# Patient Record
Sex: Male | Born: 1980 | Hispanic: No | Marital: Single
Health system: Southern US, Community
[De-identification: ages and names within clinical notes are randomized; demographics above are authoritative.]

## PROBLEM LIST (undated history)

## (undated) DIAGNOSIS — F1010 Alcohol abuse, uncomplicated: Secondary | ICD-10-CM

---

## 2019-11-28 ENCOUNTER — Emergency Department (HOSPITAL_COMMUNITY): Payer: Medicaid Other

## 2019-11-28 ENCOUNTER — Inpatient Hospital Stay (HOSPITAL_COMMUNITY)
Admission: EM | Admit: 2019-11-28 | Discharge: 2019-12-02 | DRG: 375 | Disposition: A | Discharge status: Hospice | Payer: Medicaid Other | Attending: Internal Medicine | Admitting: Internal Medicine

## 2019-11-28 ENCOUNTER — Other Ambulatory Visit: Payer: Self-pay

## 2019-11-28 ENCOUNTER — Encounter (HOSPITAL_COMMUNITY): Payer: Self-pay | Admitting: Emergency Medicine

## 2019-11-28 DIAGNOSIS — D62 Acute posthemorrhagic anemia: Secondary | ICD-10-CM | POA: Diagnosis present

## 2019-11-28 DIAGNOSIS — K56609 Unspecified intestinal obstruction, unspecified as to partial versus complete obstruction: Secondary | ICD-10-CM | POA: Diagnosis present

## 2019-11-28 DIAGNOSIS — K7460 Unspecified cirrhosis of liver: Secondary | ICD-10-CM | POA: Diagnosis present

## 2019-11-28 DIAGNOSIS — Z515 Encounter for palliative care: Secondary | ICD-10-CM | POA: Diagnosis not present

## 2019-11-28 DIAGNOSIS — Z20822 Contact with and (suspected) exposure to covid-19: Secondary | ICD-10-CM | POA: Diagnosis present

## 2019-11-28 DIAGNOSIS — C187 Malignant neoplasm of sigmoid colon: Secondary | ICD-10-CM | POA: Diagnosis present

## 2019-11-28 DIAGNOSIS — R634 Abnormal weight loss: Secondary | ICD-10-CM

## 2019-11-28 DIAGNOSIS — Z6828 Body mass index (BMI) 28.0-28.9, adult: Secondary | ICD-10-CM | POA: Diagnosis not present

## 2019-11-28 DIAGNOSIS — C189 Malignant neoplasm of colon, unspecified: Secondary | ICD-10-CM | POA: Diagnosis present

## 2019-11-28 DIAGNOSIS — K621 Rectal polyp: Secondary | ICD-10-CM | POA: Diagnosis present

## 2019-11-28 DIAGNOSIS — R748 Abnormal levels of other serum enzymes: Secondary | ICD-10-CM | POA: Diagnosis present

## 2019-11-28 DIAGNOSIS — D509 Iron deficiency anemia, unspecified: Secondary | ICD-10-CM | POA: Diagnosis present

## 2019-11-28 DIAGNOSIS — K769 Liver disease, unspecified: Secondary | ICD-10-CM | POA: Diagnosis present

## 2019-11-28 DIAGNOSIS — K633 Ulcer of intestine: Secondary | ICD-10-CM | POA: Diagnosis present

## 2019-11-28 DIAGNOSIS — R180 Malignant ascites: Secondary | ICD-10-CM | POA: Diagnosis present

## 2019-11-28 DIAGNOSIS — R188 Other ascites: Secondary | ICD-10-CM

## 2019-11-28 DIAGNOSIS — M6284 Sarcopenia: Secondary | ICD-10-CM | POA: Diagnosis present

## 2019-11-28 DIAGNOSIS — D649 Anemia, unspecified: Secondary | ICD-10-CM

## 2019-11-28 DIAGNOSIS — C787 Secondary malignant neoplasm of liver and intrahepatic bile duct: Secondary | ICD-10-CM | POA: Diagnosis present

## 2019-11-28 DIAGNOSIS — Z66 Do not resuscitate: Secondary | ICD-10-CM | POA: Diagnosis present

## 2019-11-28 DIAGNOSIS — K641 Second degree hemorrhoids: Secondary | ICD-10-CM | POA: Diagnosis present

## 2019-11-28 DIAGNOSIS — M7989 Other specified soft tissue disorders: Secondary | ICD-10-CM | POA: Insufficient documentation

## 2019-11-28 DIAGNOSIS — R630 Anorexia: Secondary | ICD-10-CM | POA: Diagnosis present

## 2019-11-28 DIAGNOSIS — Z531 Procedure and treatment not carried out because of patient's decision for reasons of belief and group pressure: Secondary | ICD-10-CM | POA: Diagnosis present

## 2019-11-28 DIAGNOSIS — Z7189 Other specified counseling: Secondary | ICD-10-CM

## 2019-11-28 HISTORY — DX: Alcohol abuse, uncomplicated: F10.10

## 2019-11-28 LAB — URINALYSIS, ROUTINE W REFLEX MICROSCOPIC
Bilirubin Urine: NEGATIVE
Glucose, UA: NEGATIVE mg/dL
Hgb urine dipstick: NEGATIVE
Ketones, ur: NEGATIVE mg/dL
Leukocytes,Ua: NEGATIVE
Nitrite: NEGATIVE
Protein, ur: NEGATIVE mg/dL
Specific Gravity, Urine: >1.046 — ABNORMAL HIGH (ref 1.005–1.030)
pH: 5 (ref 5.0–8.0)

## 2019-11-28 LAB — COMPREHENSIVE METABOLIC PANEL
ALT: 34 U/L (ref 0–44)
AST: 65 U/L — ABNORMAL HIGH (ref 15–41)
Albumin: 2.5 g/dL — ABNORMAL LOW (ref 3.5–5.0)
Alkaline Phosphatase: 400 U/L — ABNORMAL HIGH (ref 38–126)
Anion gap: 7 (ref 5–15)
BUN: 9 mg/dL (ref 6–20)
CO2: 27 mmol/L (ref 22–32)
Calcium: 8.1 mg/dL — ABNORMAL LOW (ref 8.9–10.3)
Chloride: 102 mmol/L (ref 98–111)
Creatinine, Ser: 0.59 mg/dL — ABNORMAL LOW (ref 0.61–1.24)
GFR calc Af Amer: >60 mL/min (ref >60)
GFR calc non Af Amer: >60 mL/min (ref >60)
Glucose, Bld: 93 mg/dL (ref 70–99)
Potassium: 3.8 mmol/L (ref 3.5–5.1)
Sodium: 136 mmol/L (ref 135–145)
Total Bilirubin: 0.8 mg/dL (ref 0.3–1.2)
Total Protein: 6.5 g/dL (ref 6.5–8.1)

## 2019-11-28 LAB — PROTIME-INR
INR: 1.1 (ref 0.8–1.2)
Prothrombin Time: 14.2 seconds (ref 11.4–15.2)

## 2019-11-28 LAB — CBC WITH DIFFERENTIAL/PLATELET
Abs Immature Granulocytes: 0.06 10*3/uL (ref 0.00–0.07)
Basophils Absolute: 0.1 10*3/uL (ref 0.0–0.1)
Basophils Relative: 1 %
Eosinophils Absolute: 0.2 10*3/uL (ref 0.0–0.5)
Eosinophils Relative: 3 %
HCT: 21.7 % — ABNORMAL LOW (ref 39.0–52.0)
Hemoglobin: 5.9 g/dL — CL (ref 13.0–17.0)
Immature Granulocytes: 1 %
Lymphocytes Relative: 21 %
Lymphs Abs: 1.7 10*3/uL (ref 0.7–4.0)
MCH: 22.2 pg — ABNORMAL LOW (ref 26.0–34.0)
MCHC: 27.2 g/dL — ABNORMAL LOW (ref 30.0–36.0)
MCV: 81.6 fL (ref 80.0–100.0)
Monocytes Absolute: 0.6 10*3/uL (ref 0.1–1.0)
Monocytes Relative: 7 %
Neutro Abs: 5.7 10*3/uL (ref 1.7–7.7)
Neutrophils Relative %: 67 %
Platelets: 323 10*3/uL (ref 150–400)
RBC: 2.66 MIL/uL — ABNORMAL LOW (ref 4.22–5.81)
RDW: 18 % — ABNORMAL HIGH (ref 11.5–15.5)
WBC: 8.4 10*3/uL (ref 4.0–10.5)
nRBC: 0 % (ref 0.0–0.2)

## 2019-11-28 LAB — TYPE AND SCREEN
ABO/RH(D): O POS
Antibody Screen: NEGATIVE

## 2019-11-28 LAB — POC OCCULT BLOOD, ED: Fecal Occult Bld: NEGATIVE

## 2019-11-28 LAB — HIV ANTIBODY (ROUTINE TESTING W REFLEX): HIV Screen 4th Generation wRfx: NONREACTIVE

## 2019-11-28 LAB — IRON AND TIBC
Iron: 33 ug/dL — ABNORMAL LOW (ref 45–182)
Saturation Ratios: 10 % — ABNORMAL LOW (ref 17.9–39.5)
TIBC: 344 ug/dL (ref 250–450)
UIBC: 311 ug/dL

## 2019-11-28 LAB — ABO/RH: ABO/RH(D): O POS

## 2019-11-28 LAB — FERRITIN: Ferritin: 22 ng/mL — ABNORMAL LOW (ref 24–336)

## 2019-11-28 MED ORDER — ONDANSETRON HCL 4 MG PO TABS: 4.0000 mg | ORAL_TABLET | Freq: Four times a day (QID) | ORAL | Status: DC | PRN

## 2019-11-28 MED ORDER — PEG-KCL-NACL-NASULF-NA ASC-C 100 G PO SOLR: 1.0000 | Freq: Once | ORAL | Status: DC

## 2019-11-28 MED ORDER — BISACODYL 5 MG PO TBEC
20.0000 mg | DELAYED_RELEASE_TABLET | Freq: Once | ORAL | Status: AC
  Administered 2019-11-28: 17:00:00 20 mg via ORAL
  Filled 2019-11-28: qty 4

## 2019-11-28 MED ORDER — ACETAMINOPHEN 325 MG PO TABS: 650.0000 mg | ORAL_TABLET | Freq: Four times a day (QID) | ORAL | Status: DC | PRN

## 2019-11-28 MED ORDER — SODIUM CHLORIDE 0.9 % IV SOLN
510.0000 mg | Freq: Once | INTRAVENOUS | Status: AC
  Administered 2019-11-28: 510 mg via INTRAVENOUS
  Filled 2019-11-28: qty 17

## 2019-11-28 MED ORDER — SODIUM CHLORIDE 0.9% FLUSH
3.0000 mL | Freq: Two times a day (BID) | INTRAVENOUS | Status: DC
  Administered 2019-11-28 – 2019-12-01 (×6): 3 mL via INTRAVENOUS

## 2019-11-28 MED ORDER — METOCLOPRAMIDE HCL 5 MG/ML IJ SOLN
10.0000 mg | Freq: Once | INTRAMUSCULAR | Status: AC
  Administered 2019-11-29: 10 mg via INTRAVENOUS
  Filled 2019-11-28: qty 2

## 2019-11-28 MED ORDER — ALBUTEROL SULFATE (2.5 MG/3ML) 0.083% IN NEBU: 2.5000 mg | INHALATION_SOLUTION | Freq: Four times a day (QID) | RESPIRATORY_TRACT | Status: DC | PRN

## 2019-11-28 MED ORDER — ONDANSETRON HCL 4 MG/2ML IJ SOLN: 4.0000 mg | Freq: Four times a day (QID) | INTRAMUSCULAR | Status: DC | PRN

## 2019-11-28 MED ORDER — ACETAMINOPHEN 650 MG RE SUPP: 650.0000 mg | Freq: Four times a day (QID) | RECTAL | Status: DC | PRN

## 2019-11-28 MED ORDER — PEG-KCL-NACL-NASULF-NA ASC-C 100 G PO SOLR
0.5000 | Freq: Once | ORAL | Status: AC
  Administered 2019-11-28: 100 g via ORAL
  Filled 2019-11-28: qty 1

## 2019-11-28 MED ORDER — METOCLOPRAMIDE HCL 5 MG/ML IJ SOLN
10.0000 mg | Freq: Once | INTRAMUSCULAR | Status: AC
  Administered 2019-11-28: 10 mg via INTRAVENOUS
  Filled 2019-11-28: qty 2

## 2019-11-28 MED ORDER — IOHEXOL 300 MG/ML  SOLN
100.0000 mL | Freq: Once | INTRAMUSCULAR | Status: AC | PRN
  Administered 2019-11-28: 100 mL via INTRAVENOUS

## 2019-11-28 MED ORDER — PEG-KCL-NACL-NASULF-NA ASC-C 100 G PO SOLR
0.5000 | Freq: Once | ORAL | Status: AC
  Administered 2019-11-28: 100 g via ORAL

## 2019-11-28 NOTE — ED Notes (Signed)
Pt transported to CT ?

## 2019-11-28 NOTE — H&P (View-Only) (Signed)
[facility] Consult: 2:13 PM 11/28/2019  LOS: 0 days    Referring Provider: Dr Tyrone Nine in ED  Primary Care Physician:  Patient, No Pcp Per Primary Gastroenterologist:  Eddie Zhang [phone]    Reason for Consultation:  Anemia.  Hgb 5.9   HPI: Eddie Zhang is a 39 y.o. male.  No significant medical or surgical history but does not go to the doctor.  Previously heavy alcohol use of about 8 beers daily but quit altogether May 2020.  Pt is Jehovah's Witness and refuses blood transfusion.  His first language is Spanish and his English is very limited but his cousin was able to interpret for this encounter  After stopping alcohol last year he rapidly lost about 45 pounds.  Previously weighed 225 #and currently about 184 #.   Presented to ED today for evaluation.  The bulk of his current symptoms/complaints have been present for at least 2 months.   - Constipation where he feels like he needs to have a bowel movement but only produces small amounts of solid brown stool.  Sometimes sees blood in his stool but not excessive amounts.   - Progressive swelling in his legs including thighs and lower legs.  Swelling in his abdomen which is uncomfortable as it feels tight but not painful.   - Anorexia but no nausea, vomiting.    No unusual bleeding or bruising.  Has itching on his feet, sometimes on his belly.  Fatigue, dyspnea and shortness of breath on exertion, weakness.  Urine is dark. Hgb 5.9.  MCV 81.  Platelets 323.  WBCs 8.4.  INR 1.1 T bili 0.8, alkaline phosphatase 400.  AST/ALT 65/34.  Albumin 2.5. FOBT negative. CTAP w contrast: Apple core appearance in thickened sigmoid colon suspicious for colon cancer.  Marked hepatomegaly and infiltrating masses in the liver, clearly metastatic.   Widespread lymphadenopathy.  Moderate ascites.  Diffuse anasarca.  Social history.   Alcohol consumption and sobriety as above.  Remote snorting of cocaine.  Previously lived alone but about 2 weeks ago his cousin, sees our [place] convinced Mr. Derring to move in with him.  Previously worked in [employer] but has not been employed for many months.  Family history No awareness on the part of the patient or his cousin of family history of colorectal cancer, liver disease.      Past Medical History:  Diagnosis Date  . ETOH abuse    sober since may 2020.      History reviewed. No pertinent surgical history.  Prior to Admission medications   Not on File  None  Scheduled Meds:  Infusions:  PRN Meds:    Allergies as of 11/28/2019  . (No Known Allergies)    No family history on file.  Social History   Socioeconomic History  . Marital status: Single    Spouse name: Not on file  . Number of children: Not on file  . Years of education: Not on file  . Highest education level: Not on file  Occupational History  .  Not on file  Tobacco Use  . Smoking status: Not on file  Substance and Sexual Activity  . Alcohol use: Not Currently    Comment: Quit consuming alcohol in 11/2018.  Was drinking 812 ounce cans of beer daily.  . Drug use: Not Currently    Types: Cocaine    Comment: Years ago used to snort cocaine, never injected.  Marland Kitchen Sexual activity: Not on file  Other Topics Concern  . Not on file  Social History Narrative  . Not on file   Social Determinants of Health   Financial Resource Strain:   . Difficulty of Paying Living Expenses:   Food Insecurity:   . Worried About Charity fundraiser in the Last Year:   . Arboriculturist in the Last Year:   Transportation Needs:   . Film/video editor (Medical):   Marland Kitchen Lack of Transportation (Non-Medical):   Physical Activity:   . Days of Exercise per Week:   . Minutes of Exercise per Session:   Stress:   .  Feeling of Stress :   Social Connections:   . Frequency of Communication with Friends and Family:   . Frequency of Social Gatherings with Friends and Family:   . Attends Religious Services:   . Active Member of Clubs or Organizations:   . Attends Archivist Meetings:   Marland Kitchen Marital Status:   Intimate Partner Violence:   . Fear of Current or Ex-Partner:   . Emotionally Abused:   Marland Kitchen Physically Abused:   . Sexually Abused:     REVIEW OF SYSTEMS: Constitutional: Per HPI ENT:  No nose bleeds Pulm: Per HPI. CV:  No palpitations, no LE edema.  No angina GU:  No hematuria, no frequency GI: Per HPI. Heme: Per HPI. Transfusions: None and refuses blood product transfusions for religious reasons. Neuro:  No headaches, no peripheral tingling or numbness.  No syncope, no seizures. Derm:  No rash or sores.  Itching on his feet and abdomen. Endocrine:  No sweats or chills.  No polyuria or dysuria Immunization: Has not been vaccinated for Covid and I did not ask him about other immunizations. Travel:  None beyond local counties in last few months.    PHYSICAL EXAM: Vital signs in last 24 hours: Vitals:   11/28/19 1230 11/28/19 1400  BP: (!) 113/59 113/65  Pulse: 81 87  Resp: 12 13  Temp:    SpO2: 100% 100%   Wt Readings from Last 3 Encounters:  11/28/19 83.5 kg    General: Gaunt, cachectic, slightly icteric Head: Facies symmetric.  Hollowing of facial musculature. Eyes: Slightly icteric sclera.  Pale conjunctiva. Ears: Not hard of hearing Nose: No congestion or discharge Mouth: Fair dentition.  Tongue midline.  Mucosa pink, moist, clear. Neck: No JVD, no masses. Lungs: Clear bilaterally, good breath sounds. Heart: RRR.  No MRG.  S1, S2 present Abdomen: Protuberant, distended.  Liver palpable beneath surface of skin and very bumpy.  Liver markedly enlarged to the level of the umbilicus.  Umbilical hernia.  No tenderness..   Rectal: Deferred.  Stool submitted to lab for  testing was FOBT negative earlier today. Musc/Skeltl: His arms and musculature on his face and torso are diminished with sarcopenia.  Legs are swollen with anasarca. Extremities: Anasarca in the thighs and lower legs, feet. Neurologic: No asterixis or tremor.  Moves all 4 limbs without issue, strength not tested.  Oriented x3.  Good historian with the translating assistance of his cousin. Skin:  Sallow, jaundiced.  Some scabbed over small sores on the abdomen.  No telangiectasia. Tattoos: None observed. Nodes: No cervical adenopathy Psych:  Pleasant, calm, cooperative.  Fluid speech.    Intake/Output from previous day: No intake/output data recorded. Intake/Output this shift: No intake/output data recorded.  LAB RESULTS: Recent Labs    11/28/19 1001  WBC 8.4  HGB 5.9*  HCT 21.7*  PLT 323   BMET Lab Results  Component Value Date   NA 136 11/28/2019   K 3.8 11/28/2019   CL 102 11/28/2019   CO2 27 11/28/2019   GLUCOSE 93 11/28/2019   BUN 9 11/28/2019   CREATININE 0.59 (L) 11/28/2019   CALCIUM 8.1 (L) 11/28/2019   LFT Recent Labs    11/28/19 1001  PROT 6.5  ALBUMIN 2.5*  AST 65*  ALT 34  ALKPHOS 400*  BILITOT 0.8   PT/INR Lab Results  Component Value Date   INR 1.1 11/28/2019   Hepatitis Panel No results for input(s): HEPBSAG, HCVAB, HEPAIGM, HEPBIGM in the last 72 hours. C-Diff No components found for: CDIFF Lipase  No results found for: LIPASE  Drugs of Abuse  No results found for: LABOPIA, COCAINSCRNUR, LABBENZ, AMPHETMU, THCU, LABBARB   RADIOLOGY STUDIES: CT ABDOMEN PELVIS W CONTRAST  Result Date: 11/28/2019 CLINICAL DATA:  Abdominal distension. History of cirrhosis. EXAM: CT ABDOMEN AND PELVIS WITH CONTRAST TECHNIQUE: Multidetector CT imaging of the abdomen and pelvis was performed using the standard protocol following bolus administration of intravenous contrast. CONTRAST:  129mL OMNIPAQUE IOHEXOL 300 MG/ML  SOLN COMPARISON:  None. FINDINGS: Lower  chest: No acute abnormality. Hepatobiliary: Liver is markedly enlarged. Infiltrative masses are seen throughout the liver, majority are difficult to measure due to poorly defined margins, largest measurable lesion within the inferior aspects of the LEFT liver lobe (segment 4B), measuring approximately 6 cm greatest dimension. Gallbladder is decompressed. No bile duct dilatation seen. Pancreas: Grossly normal. Spleen: Unremarkable. Adrenals/Urinary Tract: RIGHT kidney is compressed/displaced by the enlarged RIGHT hepatic lobe, but appears otherwise unremarkable without suspicious mass, stone or hydronephrosis. LEFT kidney is unremarkable without mass, stone or hydronephrosis. Bladder is decompressed. Stomach/Bowel: Irregular thickening of the walls of the sigmoid colon, highly suspicious for annular constricting apple-core neoplastic mass. Fairly large amount of stool throughout the more proximal portion of the colon which is nondistended. No dilated small bowel loops. Vascular/Lymphatic: No acute appearing vascular abnormality. Conglomerate lymphadenopathy within the upper periaortic retroperitoneum,, measuring approximately 2 cm short axis dimension (series 3, image 44). Suspect additional retroperitoneal and mesenteric lymphadenopathy, obscured by adjacent structures. Additional mildly prominent lymph nodes within the bilateral inguinal regions. Reproductive: Prostate is unremarkable. Other: Moderate amount of ascites within the abdomen and pelvis. Musculoskeletal: No acute or suspicious osseous finding. Diffuse anasarca. IMPRESSION: 1. Irregular thickening of the walls of the sigmoid colon (series 3, image 84), with an annular constricting "apple-core" appearance, highly suspicious for colon cancer. 2. Infiltrative masses throughout the liver, with associated marked hepatomegaly, majority of the liver lesions being difficult to measure due to poorly defined margins, largest measurable lesion within the inferior  aspects of the LEFT liver lobe (segment 4B), almost certainly metastatic in etiology. 3. Conglomerate lymphadenopathy within the upper periaortic retroperitoneum, measuring approximately 2 cm short axis dimension, highly suspicious for additional metastatic disease. Additional mildly prominent lymph nodes within the bilateral inguinal regions. Suspect additional retroperitoneal and mesenteric lymphadenopathy obscured by adjacent structures. 4. Moderate amount of ascites within the abdomen and pelvis. 5. Diffuse anasarca. Electronically Signed   By: Cherlynn Kaiser  Enriqueta Shutter M.D.   On: 11/28/2019 13:35      IMPRESSION:   *   Metastatic sigmoid cancer.  *    Anemia, Hb 5.9, borderline low normal MCV.  Probably iron deficient. He is a Restaurant manager, fast food and as of my conversation with pt and his cousin, patient does not want blood products.  *   Ascites.  ? Malignant?    PLAN:     *   Lack of acceptance for blood transfusion complicates work-up.  His anemia confers high risk for respiratory/cardiac complications especially if we were to perform colonoscopy which requires sedation. The other approach to a diagnosis is liver biopsy but this confers even higher risk in terms of potential bleeding complications. I encouraged the patient and his cousin to reconsider excepting transfusion.  *   Pursue CT chest for further cancer staging?   *    Allow clear liquids.   Azucena Freed  11/28/2019, 2:13 PM Phone [phone]

## 2019-11-28 NOTE — ED Provider Notes (Addendum)
[facility] EMERGENCY DEPARTMENT Provider Note   CSN: [id number] Arrival date & time: 11/28/19  0944     History Chief Complaint  Patient presents with  . Leg Swelling  . Ascites    Eddie Zhang is a 39 y.o. male.  39 y.o male with no PMH presents to the ED with a chief complaint of abdominal distention x months. According, to patient he had stopped drinking alcohol about a year ago, reports he had weight loss, this caused his abdomen to begin swelling.  He reports the swelling has been ongoing since last year, worsen these past couple months.  He also endorses several episodes of bloody diarrhea which contains blood, this has been ongoing for the past month.  According to patient he was in our diagnosed with cirrhosis.  He does report taking "an antibiotic ", such as penicillin to help with the swelling.  He denies any fever, nausea, vomiting, shortness of breath, chest pain.  The history is provided by the patient and medical records.       Past Medical History:  Diagnosis Date  . ETOH abuse    sober since may 2020.      There are no problems to display for this patient.   History reviewed. No pertinent surgical history.     No family history on file.  Social History   Tobacco Use  . Smoking status: Not on file  Substance Use Topics  . Alcohol use: Not Currently    Comment: Quit consuming alcohol in 11/2018.  Was drinking 812 ounce cans of beer daily.  . Drug use: Not Currently    Types: Cocaine    Comment: Years ago used to snort cocaine, never injected.    Home Medications Prior to Admission medications   Not on File    Allergies    Patient has no known allergies.  Review of Systems   Review of Systems  Constitutional: Negative for fever.  HENT: Negative for sore throat.   Respiratory: Negative for shortness of breath.   Cardiovascular: Negative for chest pain.  Gastrointestinal: Positive for abdominal pain and blood in stool.  Negative for nausea and vomiting.  Genitourinary: Negative for flank pain.  Neurological: Negative for light-headedness and headaches.  All other systems reviewed and are negative.   Physical Exam Updated Vital Signs BP 113/66   Pulse 88   Temp 99.4 F (37.4 C) (Oral)   Resp 16   Wt 83.5 kg   SpO2 100%   Physical Exam Vitals and nursing note reviewed.  Constitutional:      Appearance: Normal appearance. He is ill-appearing. He is not toxic-appearing.  HENT:     Head: Normocephalic and atraumatic.     Nose: Nose normal.     Mouth/Throat:     Mouth: Mucous membranes are moist.  Eyes:     General: No scleral icterus.    Pupils: Pupils are equal, round, and reactive to light.  Cardiovascular:     Rate and Rhythm: Normal rate.     Comments: Bilateral pitting edema, swelling is greater on left of the right. Pulmonary:     Effort: Pulmonary effort is normal.  Abdominal:     General: Abdomen is protuberant. There is distension.     Palpations: There is fluid wave.     Tenderness: There is generalized abdominal tenderness and tenderness in the epigastric area and left upper quadrant. There is no right CVA tenderness, left CVA tenderness, guarding or rebound.  Hernia: A hernia is present. Hernia is present in the umbilical area.  Musculoskeletal:     Cervical back: Normal range of motion and neck supple.     Right lower leg: 2+ Edema present.     Left lower leg: Tenderness present. 2+ Edema present.  Skin:    General: Skin is warm and dry.  Neurological:     Mental Status: He is alert and oriented to person, place, and time.     ED Results / Procedures / Treatments   Labs (all labs ordered are listed, but only abnormal results are displayed) Labs Reviewed  COMPREHENSIVE METABOLIC PANEL - Abnormal; Notable for the following components:      Result Value   Creatinine, Ser 0.59 (*)    Calcium 8.1 (*)    Albumin 2.5 (*)    AST 65 (*)    Alkaline Phosphatase 400 (*)     All other components within normal limits  CBC WITH DIFFERENTIAL/PLATELET - Abnormal; Notable for the following components:   RBC 2.66 (*)    Hemoglobin 5.9 (*)    HCT 21.7 (*)    MCH 22.2 (*)    MCHC 27.2 (*)    RDW 18.0 (*)    All other components within normal limits  PROTIME-INR  URINALYSIS, ROUTINE W REFLEX MICROSCOPIC  POC OCCULT BLOOD, ED    EKG None  Radiology CT ABDOMEN PELVIS W CONTRAST  Result Date: 11/28/2019 CLINICAL DATA:  Abdominal distension. History of cirrhosis. EXAM: CT ABDOMEN AND PELVIS WITH CONTRAST TECHNIQUE: Multidetector CT imaging of the abdomen and pelvis was performed using the standard protocol following bolus administration of intravenous contrast. CONTRAST:  154mL OMNIPAQUE IOHEXOL 300 MG/ML  SOLN COMPARISON:  None. FINDINGS: Lower chest: No acute abnormality. Hepatobiliary: Liver is markedly enlarged. Infiltrative masses are seen throughout the liver, majority are difficult to measure due to poorly defined margins, largest measurable lesion within the inferior aspects of the LEFT liver lobe (segment 4B), measuring approximately 6 cm greatest dimension. Gallbladder is decompressed. No bile duct dilatation seen. Pancreas: Grossly normal. Spleen: Unremarkable. Adrenals/Urinary Tract: RIGHT kidney is compressed/displaced by the enlarged RIGHT hepatic lobe, but appears otherwise unremarkable without suspicious mass, stone or hydronephrosis. LEFT kidney is unremarkable without mass, stone or hydronephrosis. Bladder is decompressed. Stomach/Bowel: Irregular thickening of the walls of the sigmoid colon, highly suspicious for annular constricting apple-core neoplastic mass. Fairly large amount of stool throughout the more proximal portion of the colon which is nondistended. No dilated small bowel loops. Vascular/Lymphatic: No acute appearing vascular abnormality. Conglomerate lymphadenopathy within the upper periaortic retroperitoneum,, measuring approximately 2 cm short  axis dimension (series 3, image 44). Suspect additional retroperitoneal and mesenteric lymphadenopathy, obscured by adjacent structures. Additional mildly prominent lymph nodes within the bilateral inguinal regions. Reproductive: Prostate is unremarkable. Other: Moderate amount of ascites within the abdomen and pelvis. Musculoskeletal: No acute or suspicious osseous finding. Diffuse anasarca. IMPRESSION: 1. Irregular thickening of the walls of the sigmoid colon (series 3, image 84), with an annular constricting "apple-core" appearance, highly suspicious for colon cancer. 2. Infiltrative masses throughout the liver, with associated marked hepatomegaly, majority of the liver lesions being difficult to measure due to poorly defined margins, largest measurable lesion within the inferior aspects of the LEFT liver lobe (segment 4B), almost certainly metastatic in etiology. 3. Conglomerate lymphadenopathy within the upper periaortic retroperitoneum, measuring approximately 2 cm short axis dimension, highly suspicious for additional metastatic disease. Additional mildly prominent lymph nodes within the bilateral inguinal regions. Suspect additional retroperitoneal  and mesenteric lymphadenopathy obscured by adjacent structures. 4. Moderate amount of ascites within the abdomen and pelvis. 5. Diffuse anasarca. Electronically Signed   By: Franki Cabot M.D.   On: 11/28/2019 13:35    Procedures .Critical Care Performed by: Janeece Fitting, PA-C Authorized by: Janeece Fitting, PA-C   Critical care provider statement:    Critical care time (minutes):  45   Critical care start time:  11/28/2019 12:00 PM   Critical care end time:  11/21/2019 12:45 PM   Critical care time was exclusive of:  Separately billable procedures and treating other patients   Critical care was necessary to treat or prevent imminent or life-threatening deterioration of the following conditions:  Circulatory failure   Critical care was time spent  personally by me on the following activities:  Blood draw for specimens, development of treatment plan with patient or surrogate, discussions with consultants, evaluation of patient's response to treatment, examination of patient, obtaining history from patient or surrogate, ordering and performing treatments and interventions, ordering and review of laboratory studies, ordering and review of radiographic studies, pulse oximetry, re-evaluation of patient's condition and review of old charts   (including critical care time)  Medications Ordered in ED Medications  iohexol (OMNIPAQUE) 300 MG/ML solution 100 mL (100 mLs Intravenous Contrast Given 11/28/19 1312)    ED Course  I have reviewed the triage vital signs and the nursing notes.  Pertinent labs & imaging results that were available during my care of the patient were reviewed by me and considered in my medical decision making (see chart for details).    MDM Rules/Calculators/A&P   Patient with no pertinent past medical history presents to the ED with complaints of swelling to the abdomen along with swelling to bilateral lower extremities which has been ongoing for the past year.  Patient reports these symptoms likely worsened in the past month, has noted this only has gotten out of control in the past 2 days.  He does not have any prior history of psoriasis, does report his belly was swollen "after him losing a bunch of weight a year ago and discontinuing alcohol use ".  Patient has been sober for over a year.  He does report taking penicillin to help with the swelling which did not improve his symptoms.  No prior history of heart failure, does endorse bloody stools, states that most of his stool has been consistent with diarrhea and blood on the toilet this has been ongoing for several weeks to months.  During my evaluation patient is ill-appearing, nontoxic, vitals are within normal limits. Lungs are clear to auscultation.  Abdomen appears  significantly distended and indurated, hepatomegaly noted on exam, fluid wave appreciated on exam.  BL 2+ pitting edema, left calf tenderness > right calf. He reports attempted to take "penicillin "for his symptoms without improvement.  Interpretation of his labs were remarkable for a CBC with a hemoglobin of 5.9, he does endorse having multiple episodes of bloody diarrhea for several months, states he notes the toilet is covered in blood after his bowel movements.  White blood cell count is within normal limits.  PT and INR are unremarkable.  CMP without any electrolyte derangement, creatinine level is decreased.  LFTs are just slightly elevated on his AST.  Phos elevated at 400.  11:52 AM Rectal exam performed by me, no gross blood on my exam.   11:48 AM Consult placed to GI, for guidance on imaging to further evaluate patients condition.  12:14 PM Spoke  to Judson Roch PA and discussed case with her, Hemoccult has returned back as negative, we discussed likely chance of malignancy as his LFTs are within normal limits.  We will proceed with CT abdomen and pelvis to further evaluate his condition.  Due to patient's symptomatic anemia will need to hospitalist service.  Of note, patient is Raymond Gurney witness and has refused blood at this time x 2.   CT Abdomen and pelvis:  1. Irregular thickening of the walls of the sigmoid colon (series 3,  image 84), with an annular constricting "apple-core" appearance,  highly suspicious for colon cancer.  2. Infiltrative masses throughout the liver, with associated marked  hepatomegaly, majority of the liver lesions being difficult to  measure due to poorly defined margins, largest measurable lesion  within the inferior aspects of the LEFT liver lobe (segment 4B),  almost certainly metastatic in etiology.  3. Conglomerate lymphadenopathy within the upper periaortic  retroperitoneum, measuring approximately 2 cm short axis dimension,  highly suspicious for  additional metastatic disease. Additional  mildly prominent lymph nodes within the bilateral inguinal regions.  Suspect additional retroperitoneal and mesenteric lymphadenopathy  obscured by adjacent structures.  4. Moderate amount of ascites within the abdomen and pelvis.  5. Diffuse anasarca.      These results were discussed with patient along with family member at the bedside.  A call was placed for hospitalist in order to obtain further admission for management of patient's condition.   2:56 PM Spoke to hospitalist Dr. Tamala Julian who will admit patient for further management.   Portions of this note were generated with Lobbyist. Dictation errors may occur despite best attempts at proofreading.  Final Clinical Impression(s) / ED Diagnoses Final diagnoses:  Leg swelling  Other ascites  Anemia, unspecified type    Rx / DC Orders ED Discharge Orders    None       Janeece Fitting, PA-C 11/28/19 [place], Nychelle Cassata, PA-C 11/28/19 [place], DO 11/28/19 1513

## 2019-11-28 NOTE — H&P (Addendum)
History and Physical    Eddie Zhang [id number] DOB: 03-Aug-1980 DOA: 11/28/2019  Referring MD/NP/PA: Gust Brooms, PA-C PCP: Patient, No Pcp Per  Patient coming from: Home   Chief Complaint: Abdominal swelling  I have personally briefly reviewed patient's old medical records in [place]   HPI: Eddie Zhang is a 39 y.o. male with medical history significant of Jehovah's witness and alcohol abuse.  He presents with complaints of significantly worsening abdominal swelling for the last 2 and half months.  History is obtained with assistance of his cousin who is present at bedside.  When he quit drinking in May 2020 patient reported weighing around 225 pounds, but since that time had loss a significant amount of weight and currently reported being down to around 184 pounds.  Over the last couple months he has intermittently had blood in his stools.  Notes associated symptoms of abdominal tightness, leg swelling, poor appetite, generalized malaise, and shortness of breath.  Denies any known family history of liver disease or colon cancer.   ED Course: Patient was noted to have relatively stable vital signs.  Labs significant for hemoglobin 5.9, alkaline phosphatase 400, AST 65, ALT 34, and total bilirubin 0.8.  CT scan of the abdomen and pelvis significant for sigmoid colon thickening with apple core sign concerning for malignancy with widespread lymphadenopathy, liver masses, and ascites.  Gastroenterology had been formally consulted.  TRH called to admit.  Review of Systems  Constitutional: Positive for malaise/fatigue.  HENT: Negative for ear discharge and nosebleeds.   Eyes: Negative for photophobia and pain.  Respiratory: Positive for shortness of breath. Negative for cough.   Cardiovascular: Positive for leg swelling. Negative for chest pain.  Gastrointestinal: Positive for constipation. Negative for diarrhea, nausea and vomiting.       Abdominal distention  Genitourinary:  Negative for dysuria and hematuria.  Musculoskeletal: Negative for falls.  Neurological: Negative for loss of consciousness.  Psychiatric/Behavioral: Negative for memory loss and substance abuse.    Past Medical History:  Diagnosis Date  . ETOH abuse    sober since may 2020.      History reviewed. No pertinent surgical history.   reports previous alcohol use. He reports previous drug use. Drug: Cocaine. No history on file for tobacco.  No Known Allergies  Family History  Problem Relation Age of Onset  . Colon cancer Neg Hx     Prior to Admission medications   Not on File    Physical Exam:  Constitutional: Ill-appearing male who appears to be in no acute distress at this time Vitals:   11/28/19 1400 11/28/19 1415 11/28/19 1430 11/28/19 1445  BP: 113/65 (!) 113/46 114/64 113/66  Pulse: 87 85 85 88  Resp: 13 13 12 16   Temp:      TempSrc:      SpO2: 100% 100% 100% 100%  Weight:       Eyes: PERRL, lids and conjunctivae normal ENMT: Mucous membranes are dry. Posterior pharynx clear of any exudate or lesions.  Neck: normal, supple, no masses, no thyromegaly Respiratory: clear to auscultation bilaterally, no wheezing, no crackles. Normal respiratory effort. No accessory muscle use.  Cardiovascular: Regular rate and rhythm, no murmurs / rubs / gallops.  At least +2 pitting lower extremity edema extremity edema. 2+ pedal pulses. No carotid bruits.  Abdomen: Very distended abdomen with hepatomegaly appreciated. Musculoskeletal: no clubbing / cyanosis. No joint deformity upper and lower extremities. Good ROM. Skin: Pallor present. Neurologic: CN 2-12 grossly intact. Sensation intact,  DTR normal. Strength 5/5 in all 4.  Psychiatric: Normal judgment and insight. Alert and oriented x 3. Normal mood.     Labs on Admission: I have personally reviewed following labs and imaging studies  CBC: Recent Labs  Lab 11/28/19 1001  WBC 8.4  NEUTROABS 5.7  HGB 5.9*  HCT 21.7*  MCV  81.6  PLT [id number]   Basic Metabolic Panel: Recent Labs  Lab 11/28/19 1001  NA 136  K 3.8  CL 102  CO2 27  GLUCOSE 93  BUN 9  CREATININE 0.59*  CALCIUM 8.1*   GFR: CrCl cannot be calculated (Unknown ideal weight.). Liver Function Tests: Recent Labs  Lab 11/28/19 1001  AST 65*  ALT 34  ALKPHOS 400*  BILITOT 0.8  PROT 6.5  ALBUMIN 2.5*   No results for input(s): LIPASE, AMYLASE in the last 168 hours. No results for input(s): AMMONIA in the last 168 hours. Coagulation Profile: Recent Labs  Lab 11/28/19 1001  INR 1.1   Cardiac Enzymes: No results for input(s): CKTOTAL, CKMB, CKMBINDEX, TROPONINI in the last 168 hours. BNP (last 3 results) No results for input(s): PROBNP in the last 8760 hours. HbA1C: No results for input(s): HGBA1C in the last 72 hours. CBG: No results for input(s): GLUCAP in the last 168 hours. Lipid Profile: No results for input(s): CHOL, HDL, LDLCALC, TRIG, CHOLHDL, LDLDIRECT in the last 72 hours. Thyroid Function Tests: No results for input(s): TSH, T4TOTAL, FREET4, T3FREE, THYROIDAB in the last 72 hours. Anemia Panel: No results for input(s): VITAMINB12, FOLATE, FERRITIN, TIBC, IRON, RETICCTPCT in the last 72 hours. Urine analysis: No results found for: COLORURINE, APPEARANCEUR, LABSPEC, PHURINE, GLUCOSEU, HGBUR, BILIRUBINUR, KETONESUR, PROTEINUR, UROBILINOGEN, NITRITE, LEUKOCYTESUR Sepsis Labs: No results found for this or any previous visit (from the past 240 hour(s)).   Radiological Exams on Admission: CT ABDOMEN PELVIS W CONTRAST  Result Date: 11/28/2019 CLINICAL DATA:  Abdominal distension. History of cirrhosis. EXAM: CT ABDOMEN AND PELVIS WITH CONTRAST TECHNIQUE: Multidetector CT imaging of the abdomen and pelvis was performed using the standard protocol following bolus administration of intravenous contrast. CONTRAST:  126mL OMNIPAQUE IOHEXOL 300 MG/ML  SOLN COMPARISON:  None. FINDINGS: Lower chest: No acute abnormality. Hepatobiliary:  Liver is markedly enlarged. Infiltrative masses are seen throughout the liver, majority are difficult to measure due to poorly defined margins, largest measurable lesion within the inferior aspects of the LEFT liver lobe (segment 4B), measuring approximately 6 cm greatest dimension. Gallbladder is decompressed. No bile duct dilatation seen. Pancreas: Grossly normal. Spleen: Unremarkable. Adrenals/Urinary Tract: RIGHT kidney is compressed/displaced by the enlarged RIGHT hepatic lobe, but appears otherwise unremarkable without suspicious mass, stone or hydronephrosis. LEFT kidney is unremarkable without mass, stone or hydronephrosis. Bladder is decompressed. Stomach/Bowel: Irregular thickening of the walls of the sigmoid colon, highly suspicious for annular constricting apple-core neoplastic mass. Fairly large amount of stool throughout the more proximal portion of the colon which is nondistended. No dilated small bowel loops. Vascular/Lymphatic: No acute appearing vascular abnormality. Conglomerate lymphadenopathy within the upper periaortic retroperitoneum,, measuring approximately 2 cm short axis dimension (series 3, image 44). Suspect additional retroperitoneal and mesenteric lymphadenopathy, obscured by adjacent structures. Additional mildly prominent lymph nodes within the bilateral inguinal regions. Reproductive: Prostate is unremarkable. Other: Moderate amount of ascites within the abdomen and pelvis. Musculoskeletal: No acute or suspicious osseous finding. Diffuse anasarca. IMPRESSION: 1. Irregular thickening of the walls of the sigmoid colon (series 3, image 84), with an annular constricting "apple-core" appearance, highly suspicious for colon cancer. 2. Infiltrative  masses throughout the liver, with associated marked hepatomegaly, majority of the liver lesions being difficult to measure due to poorly defined margins, largest measurable lesion within the inferior aspects of the LEFT liver lobe (segment 4B),  almost certainly metastatic in etiology. 3. Conglomerate lymphadenopathy within the upper periaortic retroperitoneum, measuring approximately 2 cm short axis dimension, highly suspicious for additional metastatic disease. Additional mildly prominent lymph nodes within the bilateral inguinal regions. Suspect additional retroperitoneal and mesenteric lymphadenopathy obscured by adjacent structures. 4. Moderate amount of ascites within the abdomen and pelvis. 5. Diffuse anasarca. Electronically Signed   By: Franki Cabot M.D.   On: 11/28/2019 13:35      Assessment/Plan Anasarca Colon cancer with metastases: Acute.  Patient presented with with complaints of abdominal swelling and reports of blood in stool.  CT of the abdomen and pelvis significant for thickening of the walls of the sigmoid colon with signs of metastatic disease.  Gastroenterology was formally consulted in question possibly doing a colonoscopy, but patient at high risk due to anemia and refusal of blood products. -Admit to a medical telemetry bed -Clear liquid diet for now -Appreciate gastroenterology consultative services, we will follow-up for further recommendation  Hypochromic anemia: Acute.  Hemoglobin noted to be down to 5.9 with low MCH. -Add-on iron studies   -Feraheme transfusion x once dose now  Jehovah's Witness: Patient refuses blood products even if not receiving blood products could result in his death. -Type and screen as a precaution if patient becomes agreeable to blood transfusion  Elevated liver enzymes: Acute.  On admission alkaline phosphatase elevated at 400 with AST 65.  Suspect secondary to her appears to be metastases of the liver  DVT prophylaxis: SCDs   Code Status: Full Family Communication: Discussed plan of care with the patient and family present at bedside Disposition Plan: Likely discharge home in 2 to 3 days if colonoscopy able to be performed Consults called: Gastroenterology Admission status:  Inpatient  Norval Morton MD [facility] Pager [phone]   If 7PM-7AM, please contact night-coverage [email or web address] Password [facility]  11/28/2019, 2:56 PM

## 2019-11-28 NOTE — ED Triage Notes (Signed)
Pt reports hx of cirrhosis, c/o swelling to abdomen and bilateral legs that has been ongoing but worse the past 2 days.

## 2019-11-28 NOTE — Consult Note (Signed)
[facility] Consult: 2:13 PM 11/28/2019  LOS: 0 days    Referring Provider: Dr Tyrone Nine in ED  Primary Care Physician:  Patient, No Pcp Per Primary Gastroenterologist:  Reola CalkinsEulas Post [phone]    Reason for Consultation:  Anemia.  Hgb 5.9   HPI: Eddie Zhang is a 39 y.o. male.  No significant medical or surgical history but does not go to the doctor.  Previously heavy alcohol use of about 8 beers daily but quit altogether May 2020.  Pt is Jehovah's Witness and refuses blood transfusion.  His first language is Spanish and his English is very limited but his cousin was able to interpret for this encounter  After stopping alcohol last year he rapidly lost about 45 pounds.  Previously weighed 225 #and currently about 184 #.   Presented to ED today for evaluation.  The bulk of his current symptoms/complaints have been present for at least 2 months.   - Constipation where he feels like he needs to have a bowel movement but only produces small amounts of solid brown stool.  Sometimes sees blood in his stool but not excessive amounts.   - Progressive swelling in his legs including thighs and lower legs.  Swelling in his abdomen which is uncomfortable as it feels tight but not painful.   - Anorexia but no nausea, vomiting.    No unusual bleeding or bruising.  Has itching on his feet, sometimes on his belly.  Fatigue, dyspnea and shortness of breath on exertion, weakness.  Urine is dark. Hgb 5.9.  MCV 81.  Platelets 323.  WBCs 8.4.  INR 1.1 T bili 0.8, alkaline phosphatase 400.  AST/ALT 65/34.  Albumin 2.5. FOBT negative. CTAP w contrast: Apple core appearance in thickened sigmoid colon suspicious for colon cancer.  Marked hepatomegaly and infiltrating masses in the liver, clearly metastatic.   Widespread lymphadenopathy.  Moderate ascites.  Diffuse anasarca.  Social history.   Alcohol consumption and sobriety as above.  Remote snorting of cocaine.  Previously lived alone but about 2 weeks ago his cousin, sees our [place] convinced [name] to move in with him.  Previously worked in [employer] but has not been employed for many months.  Family history No awareness on the part of the patient or his cousin of family history of colorectal cancer, liver disease.      Past Medical History:  Diagnosis Date  . ETOH abuse    sober since may 2020.      History reviewed. No pertinent surgical history.  Prior to Admission medications   Not on File  None  Scheduled Meds:  Infusions:  PRN Meds:    Allergies as of 11/28/2019  . (No Known Allergies)    No family history on file.  Social History   Socioeconomic History  . Marital status: Single    Spouse name: Not on file  . Number of children: Not on file  . Years of education: Not on file  . Highest education level: Not on file  Occupational History  .  Not on file  Tobacco Use  . Smoking status: Not on file  Substance and Sexual Activity  . Alcohol use: Not Currently    Comment: Quit consuming alcohol in 11/2018.  Was drinking 812 ounce cans of beer daily.  . Drug use: Not Currently    Types: Cocaine    Comment: Years ago used to snort cocaine, never injected.  Marland Kitchen Sexual activity: Not on file  Other Topics Concern  . Not on file  Social History Narrative  . Not on file   Social Determinants of Health   Financial Resource Strain:   . Difficulty of Paying Living Expenses:   Food Insecurity:   . Worried About Charity fundraiser in the Last Year:   . Arboriculturist in the Last Year:   Transportation Needs:   . Film/video editor (Medical):   Marland Kitchen Lack of Transportation (Non-Medical):   Physical Activity:   . Days of Exercise per Week:   . Minutes of Exercise per Session:   Stress:   .  Feeling of Stress :   Social Connections:   . Frequency of Communication with Friends and Family:   . Frequency of Social Gatherings with Friends and Family:   . Attends Religious Services:   . Active Member of Clubs or Organizations:   . Attends Archivist Meetings:   Marland Kitchen Marital Status:   Intimate Partner Violence:   . Fear of Current or Ex-Partner:   . Emotionally Abused:   Marland Kitchen Physically Abused:   . Sexually Abused:     REVIEW OF SYSTEMS: Constitutional: Per HPI ENT:  No nose bleeds Pulm: Per HPI. CV:  No palpitations, no LE edema.  No angina GU:  No hematuria, no frequency GI: Per HPI. Heme: Per HPI. Transfusions: None and refuses blood product transfusions for religious reasons. Neuro:  No headaches, no peripheral tingling or numbness.  No syncope, no seizures. Derm:  No rash or sores.  Itching on his feet and abdomen. Endocrine:  No sweats or chills.  No polyuria or dysuria Immunization: Has not been vaccinated for Covid and I did not ask him about other immunizations. Travel:  None beyond local counties in last few months.    PHYSICAL EXAM: Vital signs in last 24 hours: Vitals:   11/28/19 1230 11/28/19 1400  BP: (!) 113/59 113/65  Pulse: 81 87  Resp: 12 13  Temp:    SpO2: 100% 100%   Wt Readings from Last 3 Encounters:  11/28/19 83.5 kg    General: Gaunt, cachectic, slightly icteric Head: Facies symmetric.  Hollowing of facial musculature. Eyes: Slightly icteric sclera.  Pale conjunctiva. Ears: Not hard of hearing Nose: No congestion or discharge Mouth: Fair dentition.  Tongue midline.  Mucosa pink, moist, clear. Neck: No JVD, no masses. Lungs: Clear bilaterally, good breath sounds. Heart: RRR.  No MRG.  S1, S2 present Abdomen: Protuberant, distended.  Liver palpable beneath surface of skin and very bumpy.  Liver markedly enlarged to the level of the umbilicus.  Umbilical hernia.  No tenderness..   Rectal: Deferred.  Stool submitted to lab for  testing was FOBT negative earlier today. Musc/Skeltl: His arms and musculature on his face and torso are diminished with sarcopenia.  Legs are swollen with anasarca. Extremities: Anasarca in the thighs and lower legs, feet. Neurologic: No asterixis or tremor.  Moves all 4 limbs without issue, strength not tested.  Oriented x3.  Good historian with the translating assistance of his cousin. Skin:  Sallow, jaundiced.  Some scabbed over small sores on the abdomen.  No telangiectasia. Tattoos: None observed. Nodes: No cervical adenopathy Psych:  Pleasant, calm, cooperative.  Fluid speech.    Intake/Output from previous day: No intake/output data recorded. Intake/Output this shift: No intake/output data recorded.  LAB RESULTS: Recent Labs    11/28/19 1001  WBC 8.4  HGB 5.9*  HCT 21.7*  PLT 323   BMET Lab Results  Component Value Date   NA 136 11/28/2019   K 3.8 11/28/2019   CL 102 11/28/2019   CO2 27 11/28/2019   GLUCOSE 93 11/28/2019   BUN 9 11/28/2019   CREATININE 0.59 (L) 11/28/2019   CALCIUM 8.1 (L) 11/28/2019   LFT Recent Labs    11/28/19 1001  PROT 6.5  ALBUMIN 2.5*  AST 65*  ALT 34  ALKPHOS 400*  BILITOT 0.8   PT/INR Lab Results  Component Value Date   INR 1.1 11/28/2019   Hepatitis Panel No results for input(s): HEPBSAG, HCVAB, HEPAIGM, HEPBIGM in the last 72 hours. C-Diff No components found for: CDIFF Lipase  No results found for: LIPASE  Drugs of Abuse  No results found for: LABOPIA, COCAINSCRNUR, LABBENZ, AMPHETMU, THCU, LABBARB   RADIOLOGY STUDIES: CT ABDOMEN PELVIS W CONTRAST  Result Date: 11/28/2019 CLINICAL DATA:  Abdominal distension. History of cirrhosis. EXAM: CT ABDOMEN AND PELVIS WITH CONTRAST TECHNIQUE: Multidetector CT imaging of the abdomen and pelvis was performed using the standard protocol following bolus administration of intravenous contrast. CONTRAST:  190mL OMNIPAQUE IOHEXOL 300 MG/ML  SOLN COMPARISON:  None. FINDINGS: Lower  chest: No acute abnormality. Hepatobiliary: Liver is markedly enlarged. Infiltrative masses are seen throughout the liver, majority are difficult to measure due to poorly defined margins, largest measurable lesion within the inferior aspects of the LEFT liver lobe (segment 4B), measuring approximately 6 cm greatest dimension. Gallbladder is decompressed. No bile duct dilatation seen. Pancreas: Grossly normal. Spleen: Unremarkable. Adrenals/Urinary Tract: RIGHT kidney is compressed/displaced by the enlarged RIGHT hepatic lobe, but appears otherwise unremarkable without suspicious mass, stone or hydronephrosis. LEFT kidney is unremarkable without mass, stone or hydronephrosis. Bladder is decompressed. Stomach/Bowel: Irregular thickening of the walls of the sigmoid colon, highly suspicious for annular constricting apple-core neoplastic mass. Fairly large amount of stool throughout the more proximal portion of the colon which is nondistended. No dilated small bowel loops. Vascular/Lymphatic: No acute appearing vascular abnormality. Conglomerate lymphadenopathy within the upper periaortic retroperitoneum,, measuring approximately 2 cm short axis dimension (series 3, image 44). Suspect additional retroperitoneal and mesenteric lymphadenopathy, obscured by adjacent structures. Additional mildly prominent lymph nodes within the bilateral inguinal regions. Reproductive: Prostate is unremarkable. Other: Moderate amount of ascites within the abdomen and pelvis. Musculoskeletal: No acute or suspicious osseous finding. Diffuse anasarca. IMPRESSION: 1. Irregular thickening of the walls of the sigmoid colon (series 3, image 84), with an annular constricting "apple-core" appearance, highly suspicious for colon cancer. 2. Infiltrative masses throughout the liver, with associated marked hepatomegaly, majority of the liver lesions being difficult to measure due to poorly defined margins, largest measurable lesion within the inferior  aspects of the LEFT liver lobe (segment 4B), almost certainly metastatic in etiology. 3. Conglomerate lymphadenopathy within the upper periaortic retroperitoneum, measuring approximately 2 cm short axis dimension, highly suspicious for additional metastatic disease. Additional mildly prominent lymph nodes within the bilateral inguinal regions. Suspect additional retroperitoneal and mesenteric lymphadenopathy obscured by adjacent structures. 4. Moderate amount of ascites within the abdomen and pelvis. 5. Diffuse anasarca. Electronically Signed   By: Cherlynn Kaiser  Enriqueta Shutter M.D.   On: 11/28/2019 13:35      IMPRESSION:   *   Metastatic sigmoid cancer.  *    Anemia, Hb 5.9, borderline low normal MCV.  Probably iron deficient. He is a Restaurant manager, fast food and as of my conversation with pt and his cousin, patient does not want blood products.  *   Ascites.  ? Malignant?    PLAN:     *   Lack of acceptance for blood transfusion complicates work-up.  His anemia confers high risk for respiratory/cardiac complications especially if we were to perform colonoscopy which requires sedation. The other approach to a diagnosis is liver biopsy but this confers even higher risk in terms of potential bleeding complications. I encouraged the patient and his cousin to reconsider excepting transfusion.  *   Pursue CT chest for further cancer staging?   *    Allow clear liquids.   Azucena Freed  11/28/2019, 2:13 PM Phone [phone]

## 2019-11-29 ENCOUNTER — Encounter (HOSPITAL_COMMUNITY): Payer: Self-pay | Admitting: Internal Medicine

## 2019-11-29 ENCOUNTER — Encounter (HOSPITAL_COMMUNITY)
Admission: EM | Disposition: A | Discharge status: Hospice | Payer: Self-pay | Source: Home / Self Care | Attending: Internal Medicine

## 2019-11-29 ENCOUNTER — Inpatient Hospital Stay (HOSPITAL_COMMUNITY): Payer: Medicaid Other | Admitting: Anesthesiology

## 2019-11-29 DIAGNOSIS — M7989 Other specified soft tissue disorders: Secondary | ICD-10-CM

## 2019-11-29 DIAGNOSIS — K56691 Other complete intestinal obstruction: Secondary | ICD-10-CM

## 2019-11-29 DIAGNOSIS — K621 Rectal polyp: Secondary | ICD-10-CM

## 2019-11-29 HISTORY — PX: BIOPSY: SHX5522

## 2019-11-29 HISTORY — PX: FLEXIBLE SIGMOIDOSCOPY: SHX5431

## 2019-11-29 LAB — RESPIRATORY PANEL BY RT PCR (FLU A&B, COVID)
Influenza A by PCR: NEGATIVE
Influenza B by PCR: NEGATIVE
SARS Coronavirus 2 by RT PCR: NEGATIVE

## 2019-11-29 LAB — HEMOGLOBIN AND HEMATOCRIT, BLOOD
HCT: 20.8 % — ABNORMAL LOW (ref 39.0–52.0)
Hemoglobin: 5.8 g/dL — CL (ref 13.0–17.0)

## 2019-11-29 LAB — VITAMIN B12: Vitamin B-12: 232 pg/mL (ref 180–914)

## 2019-11-29 SURGERY — SIGMOIDOSCOPY, FLEXIBLE
Anesthesia: General

## 2019-11-29 MED ORDER — LACTATED RINGERS IV SOLN
INTRAVENOUS | Status: DC
  Administered 2019-11-29: 1000 mL via INTRAVENOUS

## 2019-11-29 MED ORDER — CYANOCOBALAMIN 1000 MCG/ML IJ SOLN
1000.0000 ug | Freq: Once | INTRAMUSCULAR | Status: AC
  Administered 2019-11-29: 1000 ug via INTRAMUSCULAR
  Filled 2019-11-29: qty 1

## 2019-11-29 MED ORDER — ALBUMIN HUMAN 5 % IV SOLN: INTRAVENOUS | Status: DC | PRN

## 2019-11-29 MED ORDER — LIDOCAINE HCL (CARDIAC) PF 100 MG/5ML IV SOSY
PREFILLED_SYRINGE | INTRAVENOUS | Status: DC | PRN
  Administered 2019-11-29: 80 mg via INTRATRACHEAL

## 2019-11-29 MED ORDER — PHENYLEPHRINE HCL-NACL 10-0.9 MG/250ML-% IV SOLN
INTRAVENOUS | Status: DC | PRN
  Administered 2019-11-29: 25 ug/min via INTRAVENOUS

## 2019-11-29 MED ORDER — PHENYLEPHRINE HCL (PRESSORS) 10 MG/ML IV SOLN
INTRAVENOUS | Status: DC | PRN
  Administered 2019-11-29: 120 ug via INTRAVENOUS

## 2019-11-29 MED ORDER — ONDANSETRON HCL 4 MG/2ML IJ SOLN
INTRAMUSCULAR | Status: DC | PRN
  Administered 2019-11-29: 4 mg via INTRAVENOUS

## 2019-11-29 MED ORDER — PROPOFOL 500 MG/50ML IV EMUL
INTRAVENOUS | Status: DC | PRN
  Administered 2019-11-29: 30 ug/kg/min via INTRAVENOUS

## 2019-11-29 MED ORDER — PROPOFOL 10 MG/ML IV BOLUS
INTRAVENOUS | Status: DC | PRN
  Administered 2019-11-29: 20 mg via INTRAVENOUS

## 2019-11-29 MED ORDER — DARBEPOETIN ALFA 40 MCG/0.4ML IJ SOSY
40.0000 ug | PREFILLED_SYRINGE | INTRAMUSCULAR | Status: DC
  Administered 2019-11-29: 40 ug via SUBCUTANEOUS
  Filled 2019-11-29: qty 0.4

## 2019-11-29 MED ORDER — FERROUS SULFATE 325 (65 FE) MG PO TABS
325.0000 mg | ORAL_TABLET | Freq: Three times a day (TID) | ORAL | Status: DC
  Administered 2019-11-30 – 2019-12-02 (×8): 325 mg via ORAL
  Filled 2019-11-29 (×8): qty 1

## 2019-11-29 SURGICAL SUPPLY — 22 items

## 2019-11-29 NOTE — Progress Notes (Signed)
Neo drip left on patient by CRNA. Place on pump per protocol. Drip decreased per protocol. Drip decreased to 49mcg/min at 1338 Blood pressure stable . Drip turned off at 1350. Continue to assess bp prior to return to floor.

## 2019-11-29 NOTE — Transfer of Care (Signed)
Immediate Anesthesia Transfer of Care Note  Patient: Eddie Zhang  Procedure(s) Performed: FLEXIBLE SIGMOIDOSCOPY (N/A ) BIOPSY  Patient Location: Endoscopy Unit  Anesthesia Type:MAC  Level of Consciousness: awake, alert , oriented and patient cooperative  Airway & Oxygen Therapy: Patient Spontanous Breathing  Post-op Assessment: Report given to RN and Post -op Vital signs reviewed and stable  Post vital signs: Reviewed and stable  Last Vitals:  Vitals Value Taken Time  BP 114/58 11/29/19 1325  Temp    Pulse 88 11/29/19 1325  Resp 14 11/29/19 1325  SpO2 96 % 11/29/19 1325  Vitals shown include unvalidated device data.  Last Pain:  Vitals:   11/29/19 1137  TempSrc: Oral  PainSc: 0-No pain         Complications: No apparent anesthesia complications

## 2019-11-29 NOTE — Anesthesia Procedure Notes (Signed)
Procedure Name: MAC Date/Time: 11/29/2019 1:08 PM Performed by: Kathryne Hitch, CRNA Pre-anesthesia Checklist: Patient identified, Emergency Drugs available, Suction available and Patient being monitored Patient Re-evaluated:Patient Re-evaluated prior to induction Oxygen Delivery Method: Simple face mask Induction Type: IV induction Dental Injury: Teeth and Oropharynx as per pre-operative assessment

## 2019-11-29 NOTE — Progress Notes (Signed)
CRITICAL VALUE ALERT  Critical Value:  Hgb 5.8  Date & Time Notied:  11/29/19, [id number]  Provider Notified: Dr. Cruzita Lederer  Orders Received/Actions taken: Patient is Eddie Zhang and refuses blood products.

## 2019-11-29 NOTE — Progress Notes (Signed)
Received patient from endo. Patient alert and oriented, complains of no pain, ready to eat. Will continue to monitor.

## 2019-11-29 NOTE — Anesthesia Postprocedure Evaluation (Signed)
Anesthesia Post Note  Patient: Eddie Zhang  Procedure(s) Performed: FLEXIBLE SIGMOIDOSCOPY (N/A ) BIOPSY     Patient location during evaluation: Endoscopy Anesthesia Type: MAC Level of consciousness: awake Pain management: pain level controlled Vital Signs Assessment: post-procedure vital signs reviewed and stable Respiratory status: spontaneous breathing Cardiovascular status: stable Postop Assessment: no apparent nausea or vomiting Anesthetic complications: no    Last Vitals:  Vitals:   11/29/19 1350 11/29/19 1355  BP: (!) 109/58 116/63  Pulse: 83 86  Resp: 11 10  Temp:    SpO2: 97% 99%    Last Pain:  Vitals:   11/29/19 1349  TempSrc:   PainSc: 0-No pain                 Coburn Knaus

## 2019-11-29 NOTE — Progress Notes (Signed)
PROGRESS NOTE  Eddie Zhang [id number] DOB: 05-04-1981 DOA: 11/28/2019 PCP: Patient, No Pcp Per   LOS: 1 day   Brief Narrative / Interim history: 39 year old male with history of alcohol abuse, quit in May 2020, presents to the hospital with worsening abdominal swelling for the past 2-1/2 months. He reports significant weight loss in the last several months, when he quit drinking weighing 225 pounds but he is down to 184. He also has noticed intermittent blood in stools over the last couple of months. He has noticed abdominal distention, tightness, leg swelling, poor appetite, generalized malaise and difficulties breathing. He is also Jehovah's Witness and is refusing blood products. In the ED he was found to have probable colon cancer with liver metastasis. GI consulted  Subjective / 24h Interval events: He is doing fairly well this morning, complains of generalized weakness and swelling. Awaiting colonoscopy.  Assessment & Plan: Principal Problem Colon cancer with liver metastasis-patient was admitted to the hospital with presumed diagnosis of colon cancer with liver metastasis. Gastroenterology has been consulted and plans are in place for colonoscopy today. I see that he has not had any Covid screening, I have ordered a 2-hour test, better to wait for the results prior to him being taken to the endoscopy suite  Active Problems Acute on chronic blood loss anemia-patient has been having intermittent lower GI bleed probably from the colon cancer. He is a Sales promotion account executive Witness, I have extensively discussed at bedside with the patient and he is determined not to receive any blood products. He understands the risk of complications including death due to lack of oxygen the heart, brain and all his other organs. He is signed initialed the blood refusal consent form. He received IV iron, continue oral iron, will give erythropoietin as well as vitamin B12.  Goals of care-I told the patient that in most  cases if this is truly colon cancer and it has already spread to the liver, the cancer is not always curable but we have to wait on the biopsy the first understand which type of cancer it is. He is at very high risk of poor outcome especially due to his profound anemia and if he is to receive surgery or chemotherapy, his refusal to get blood will probably complicate things. He expressed understanding. We also discussed about CODE STATUS and at this point he wishes to remain full code.  Elevated LFTs-due to liver mets  Scheduled Meds: . metoCLOPramide (REGLAN) injection  10 mg Intravenous Once  . sodium chloride flush  3 mL Intravenous Q12H   Continuous Infusions: PRN Meds:.acetaminophen **OR** acetaminophen, albuterol, ondansetron **OR** ondansetron (ZOFRAN) IV  DVT prophylaxis: SCDs Code Status: Full code Family Communication: d/w patient  Status is: Inpatient  Remains inpatient appropriate because:Ongoing diagnostic testing needed not appropriate for outpatient work up   Dispo: The patient is from: Home              Anticipated d/c is to: Home              Anticipated d/c date is: 3 days              Patient currently is not medically stable to d/c.  Consultants:  GI Oncology   Procedures:  None   Microbiology  None   Antimicrobials: None     Objective: Vitals:   11/28/19 1700 11/28/19 2101 11/29/19 0306 11/29/19 0547  BP: 113/61 (!) 123/58 106/62 117/60  Pulse: 84 86 82 97  Resp: 18 16 12  17  Temp: 99.1 F (37.3 C) 99.1 F (37.3 C) 98.5 F (36.9 C) 97.7 F (36.5 C)  TempSrc: Oral Oral Oral Oral  SpO2: 100% 98% 97% 99%  Weight:        Intake/Output Summary (Last 24 hours) at 11/29/2019 [id number] Last data filed at 11/28/2019 2359 Gross per 24 hour  Intake 2117 ml  Output --  Net 2117 ml   Filed Weights   11/28/19 1140  Weight: 83.5 kg    Examination:  Constitutional: Cachectic, ill-appearing gentleman Eyes: no scleral icterus ENMT: Mucous membranes are  moist.  Neck: normal, supple Respiratory: clear to auscultation bilaterally, no wheezing, no crackles. Cardiovascular: Regular rate and rhythm, no murmurs / rubs / gallops. 2+ pitting edema Abdomen: Abdomen quite distended, bowel sounds positive Musculoskeletal: no clubbing / cyanosis.  Skin: no rashes Neurologic: Nonfocal  Data Reviewed: I have independently reviewed following labs and imaging studies   CBC: Recent Labs  Lab 11/28/19 1001  WBC 8.4  NEUTROABS 5.7  HGB 5.9*  HCT 21.7*  MCV 81.6  PLT [id number]   Basic Metabolic Panel: Recent Labs  Lab 11/28/19 1001  NA 136  K 3.8  CL 102  CO2 27  GLUCOSE 93  BUN 9  CREATININE 0.59*  CALCIUM 8.1*   Liver Function Tests: Recent Labs  Lab 11/28/19 1001  AST 65*  ALT 34  ALKPHOS 400*  BILITOT 0.8  PROT 6.5  ALBUMIN 2.5*   Coagulation Profile: Recent Labs  Lab 11/28/19 1001  INR 1.1   HbA1C: No results for input(s): HGBA1C in the last 72 hours. CBG: No results for input(s): GLUCAP in the last 168 hours.  No results found for this or any previous visit (from the past 240 hour(s)).   Radiology Studies: CT ABDOMEN PELVIS W CONTRAST  Result Date: 11/28/2019 CLINICAL DATA:  Abdominal distension. History of cirrhosis. EXAM: CT ABDOMEN AND PELVIS WITH CONTRAST TECHNIQUE: Multidetector CT imaging of the abdomen and pelvis was performed using the standard protocol following bolus administration of intravenous contrast. CONTRAST:  119mL OMNIPAQUE IOHEXOL 300 MG/ML  SOLN COMPARISON:  None. FINDINGS: Lower chest: No acute abnormality. Hepatobiliary: Liver is markedly enlarged. Infiltrative masses are seen throughout the liver, majority are difficult to measure due to poorly defined margins, largest measurable lesion within the inferior aspects of the LEFT liver lobe (segment 4B), measuring approximately 6 cm greatest dimension. Gallbladder is decompressed. No bile duct dilatation seen. Pancreas: Grossly normal. Spleen:  Unremarkable. Adrenals/Urinary Tract: RIGHT kidney is compressed/displaced by the enlarged RIGHT hepatic lobe, but appears otherwise unremarkable without suspicious mass, stone or hydronephrosis. LEFT kidney is unremarkable without mass, stone or hydronephrosis. Bladder is decompressed. Stomach/Bowel: Irregular thickening of the walls of the sigmoid colon, highly suspicious for annular constricting apple-core neoplastic mass. Fairly large amount of stool throughout the more proximal portion of the colon which is nondistended. No dilated small bowel loops. Vascular/Lymphatic: No acute appearing vascular abnormality. Conglomerate lymphadenopathy within the upper periaortic retroperitoneum,, measuring approximately 2 cm short axis dimension (series 3, image 44). Suspect additional retroperitoneal and mesenteric lymphadenopathy, obscured by adjacent structures. Additional mildly prominent lymph nodes within the bilateral inguinal regions. Reproductive: Prostate is unremarkable. Other: Moderate amount of ascites within the abdomen and pelvis. Musculoskeletal: No acute or suspicious osseous finding. Diffuse anasarca. IMPRESSION: 1. Irregular thickening of the walls of the sigmoid colon (series 3, image 84), with an annular constricting "apple-core" appearance, highly suspicious for colon cancer. 2. Infiltrative masses throughout the liver, with associated marked hepatomegaly, majority  of the liver lesions being difficult to measure due to poorly defined margins, largest measurable lesion within the inferior aspects of the LEFT liver lobe (segment 4B), almost certainly metastatic in etiology. 3. Conglomerate lymphadenopathy within the upper periaortic retroperitoneum, measuring approximately 2 cm short axis dimension, highly suspicious for additional metastatic disease. Additional mildly prominent lymph nodes within the bilateral inguinal regions. Suspect additional retroperitoneal and mesenteric lymphadenopathy obscured  by adjacent structures. 4. Moderate amount of ascites within the abdomen and pelvis. 5. Diffuse anasarca. Electronically Signed   By: Franki Cabot M.D.   On: 11/28/2019 13:35   Time spent: 40 minutes, > 50% bedside in direct counseling regarding disease process, goals of care as detailed above  Marzetta Board, MD, PhD [facility]  Between 7 am - 7 pm I am available, please contact me via Amion or Securechat  Between 7 pm - 7 am I am not available, please contact night coverage MD/APP via Amion

## 2019-11-29 NOTE — Interval H&P Note (Signed)
History and Physical Interval Note:  11/29/2019 12:00 PM  Ehab Markiewicz  has presented today for surgery, with the diagnosis of sigmoid colon mass, mets to liver, Hgb 5.9 on admission, Jehovahs Witness/no blood products..  The various methods of treatment have been discussed with the patient and family. After consideration of risks, benefits and other options for treatment, the patient has consented to  Procedure(s): COLONOSCOPY WITH PROPOFOL (N/A) as a surgical intervention.  The patient's history has been reviewed, patient examined, no change in status, stable for surgery.  I have reviewed the patient's chart and labs.  Questions were answered to the patient's satisfaction using the interpretor line.   We discussed the additional periprocedural risks related to his anemia (hemoglobin/hematocrit 5.8/20.8 today).  Since he declines transfusions due to religious reasons, unable to correct prior to surgery.  Discussed the anemia presents not only procedural risks but also cardiovascular risks related to sedation.  He understands all of this and still wishes to proceed and still declines blood products.  Was treated with IV Feraheme last evening.   Dominic Pea Lazariah Savard

## 2019-11-29 NOTE — Op Note (Signed)
[facility] Patient Name: Eddie Zhang Procedure Date : 11/29/2019 MRN: [id number] Attending MD: Gerrit Heck , MD Date of Birth: Oct 19, 1980 CSN: [id number] Age: 39 Admit Type: Inpatient Procedure:                Flexible Sigmoidoscopy Indications:              Abnormal CT of the GI tract, Hematochezia, Weight                            loss, Constipation, Change in bowel habits                           39 year old male presenting with 45#weight loss,                            change in bowel habits, constipation, occasional                            BRBPR, lower extremity edema, abdominal distention,                            anorexia over the last 2-3 months. Significant                            temporal wasting and sarcopenia on exam. Admission                            evaluation as below:                           ?"H/H 5.9/21.7 with MCV/RDW 81/18                           ?"WBC 8.4, PLT 323                           ?"Albumin 2.5, AST/ALT 65/34, T bili 0.8, ALP 400                           ?"INR 1.1                           ?"Normal BMP                           ?"FOBT negative                           ?"CT: Apple core lesion in the sigmoid colon,                            hepatomegaly with multiple masses c/f metastatic                            disease, lymphadenopathy, moderate ascites, anasarca Providers:                Gerrit Heck, MD, Carlyn Reichert,  RN, Laverda Sorenson, Technician, Haze Boyden, CRNA Referring MD:              Medicines:                Monitored Anesthesia Care Complications:            No immediate complications. Estimated Blood Loss:     Estimated blood loss was minimal. Procedure:                Pre-Anesthesia Assessment:                           - Prior to the procedure, a History and Physical                            was performed, and patient medications and         allergies were reviewed. The patient's tolerance of                            previous anesthesia was also reviewed. The risks                            and benefits of the procedure and the sedation                            options and risks were discussed with the patient.                            All questions were answered, and informed consent                            was obtained. Prior Anticoagulants: The patient has                            taken no previous anticoagulant or antiplatelet                            agents. ASA Grade Assessment: III - A patient with                            severe systemic disease. After reviewing the risks                            and benefits, the patient was deemed in                            satisfactory condition to undergo the procedure.                           After obtaining informed consent, the scope was                            passed under  direct vision. The [id number]                            Olympus gastroscope was introduced through the anus                            and advanced to the the sigmoid colon. After                            obtaining informed consent, the scope was passed                            under direct vision.The flexible sigmoidoscopy was                            accomplished without difficulty. The patient                            tolerated the procedure well. The quality of the                            bowel preparation was adequate. Scope In: 1:11:29 PM Scope Out: 1:17:01 PM Total Procedure Duration: 0 hours 5 minutes 32 seconds  Findings:      Hemorrhoids were found on perianal exam.      A frond-like/villous, fungating and ulcerated completely obstructing       large mass was found in the distal sigmoid colon, located 18 cm from the       anal verge. No bleeding was present. This was not traversable even with       a standard gastroscope. This was biopsied with a  cold forceps for       histology. Estimated blood loss was minimal.      A 25 mm polyp was found in the rectum. The polyp was pedunculated. This       was not resected.      Non-bleeding internal hemorrhoids were found during retroflexion. The       hemorrhoids were small and Grade II (internal hemorrhoids that prolapse       but reduce spontaneously). Impression:               - Hemorrhoids found on perianal exam.                           - Malignant completely obstructing tumor in the                            distal sigmoid colon, located 18 cm from the anal                            verge. Not traversable. Biopsied.                           - One 25 mm polyp in the rectum.                           - Non-bleeding internal hemorrhoids.  Recommendation:           - Return patient to hospital ward for ongoing care.                           - Full liquid diet today.                           - Continue present medications.                           - Await pathology results.                           - Recommend Oncology consult. Procedure Code(s):        --- Professional ---                           [id number], Sigmoidoscopy, flexible; with biopsy, single                            or multiple Diagnosis Code(s):        --- Professional ---                           K64.1, Second degree hemorrhoids                           C18.7, Malignant neoplasm of sigmoid colon                           K56.691, Other complete intestinal obstruction                           K62.1, Rectal polyp                           K92.1, Melena (includes Hematochezia)                           R63.4, Abnormal weight loss                           K59.00, Constipation, unspecified                           R19.4, Change in bowel habit                           R93.3, Abnormal findings on diagnostic imaging of                            other parts of digestive tract CPT copyright 2019 American Medical  Association. All rights reserved. The codes documented in this report are preliminary and upon coder review may  be revised to meet current compliance requirements. Gerrit Heck, MD 11/29/2019 1:24:53 PM Number of Addenda: 0

## 2019-11-29 NOTE — Anesthesia Preprocedure Evaluation (Signed)
Anesthesia Evaluation  Patient identified by MRN, date of birth, ID band Patient awake    Reviewed: Allergy & Precautions, NPO status , Patient's Chart, lab work & pertinent test results  Airway Mallampati: II       Dental   Pulmonary neg pulmonary ROS,    breath sounds clear to auscultation       Cardiovascular negative cardio ROS   Rhythm:Regular Rate:Normal     Neuro/Psych PSYCHIATRIC DISORDERS    GI/Hepatic negative GI ROS, Neg liver ROS,   Endo/Other  negative endocrine ROS  Renal/GU negative Renal ROS     Musculoskeletal   Abdominal   Peds  Hematology  (+) anemia ,   Anesthesia Other Findings   Reproductive/Obstetrics                             Anesthesia Physical Anesthesia Plan  ASA: III  Anesthesia Plan: General   Post-op Pain Management:    Induction: Intravenous  PONV Risk Score and Plan: 2 and Ondansetron  Airway Management Planned: Nasal Cannula and Simple Face Mask  Additional Equipment:   Intra-op Plan:   Post-operative Plan:   Informed Consent: I have reviewed the patients History and Physical, chart, labs and discussed the procedure including the risks, benefits and alternatives for the proposed anesthesia with the patient or authorized representative who has indicated his/her understanding and acceptance.     Dental advisory given  Plan Discussed with: CRNA and Anesthesiologist  Anesthesia Plan Comments:         Anesthesia Quick Evaluation

## 2019-11-30 ENCOUNTER — Inpatient Hospital Stay (HOSPITAL_COMMUNITY): Payer: Medicaid Other

## 2019-11-30 DIAGNOSIS — R634 Abnormal weight loss: Secondary | ICD-10-CM

## 2019-11-30 DIAGNOSIS — C787 Secondary malignant neoplasm of liver and intrahepatic bile duct: Secondary | ICD-10-CM

## 2019-11-30 DIAGNOSIS — C189 Malignant neoplasm of colon, unspecified: Secondary | ICD-10-CM

## 2019-11-30 DIAGNOSIS — D509 Iron deficiency anemia, unspecified: Secondary | ICD-10-CM

## 2019-11-30 DIAGNOSIS — Z7189 Other specified counseling: Secondary | ICD-10-CM

## 2019-11-30 DIAGNOSIS — D500 Iron deficiency anemia secondary to blood loss (chronic): Secondary | ICD-10-CM

## 2019-11-30 DIAGNOSIS — Z515 Encounter for palliative care: Secondary | ICD-10-CM

## 2019-11-30 LAB — PROTEIN, PLEURAL OR PERITONEAL FLUID: Total protein, fluid: <3 g/dL

## 2019-11-30 LAB — COMPREHENSIVE METABOLIC PANEL
ALT: 41 U/L (ref 0–44)
AST: 73 U/L — ABNORMAL HIGH (ref 15–41)
Albumin: 2.6 g/dL — ABNORMAL LOW (ref 3.5–5.0)
Alkaline Phosphatase: 390 U/L — ABNORMAL HIGH (ref 38–126)
Anion gap: 9 (ref 5–15)
BUN: 8 mg/dL (ref 6–20)
CO2: 26 mmol/L (ref 22–32)
Calcium: 8.3 mg/dL — ABNORMAL LOW (ref 8.9–10.3)
Chloride: 103 mmol/L (ref 98–111)
Creatinine, Ser: 0.62 mg/dL (ref 0.61–1.24)
GFR calc Af Amer: >60 mL/min (ref >60)
GFR calc non Af Amer: >60 mL/min (ref >60)
Glucose, Bld: 87 mg/dL (ref 70–99)
Potassium: 3.6 mmol/L (ref 3.5–5.1)
Sodium: 138 mmol/L (ref 135–145)
Total Bilirubin: 0.9 mg/dL (ref 0.3–1.2)
Total Protein: 6.2 g/dL — ABNORMAL LOW (ref 6.5–8.1)

## 2019-11-30 LAB — BODY FLUID CELL COUNT WITH DIFFERENTIAL
Eos, Fluid: 1 %
Lymphs, Fluid: 33 %
Monocyte-Macrophage-Serous Fluid: 60 % (ref 50–90)
Neutrophil Count, Fluid: 6 % (ref 0–25)
Total Nucleated Cell Count, Fluid: 428 cu mm (ref 0–1000)

## 2019-11-30 LAB — CBC
HCT: 21.1 % — ABNORMAL LOW (ref 39.0–52.0)
Hemoglobin: 5.9 g/dL — CL (ref 13.0–17.0)
MCH: 22.5 pg — ABNORMAL LOW (ref 26.0–34.0)
MCHC: 28 g/dL — ABNORMAL LOW (ref 30.0–36.0)
MCV: 80.5 fL (ref 80.0–100.0)
Platelets: 358 10*3/uL (ref 150–400)
RBC: 2.62 MIL/uL — ABNORMAL LOW (ref 4.22–5.81)
RDW: 18.5 % — ABNORMAL HIGH (ref 11.5–15.5)
WBC: 9.4 10*3/uL (ref 4.0–10.5)
nRBC: 0.2 % (ref 0.0–0.2)

## 2019-11-30 LAB — MAGNESIUM: Magnesium: 2.1 mg/dL (ref 1.7–2.4)

## 2019-11-30 LAB — ALBUMIN, PLEURAL OR PERITONEAL FLUID: Albumin, Fluid: 1.3 g/dL

## 2019-11-30 LAB — PHOSPHORUS: Phosphorus: 5 mg/dL — ABNORMAL HIGH (ref 2.5–4.6)

## 2019-11-30 MED ORDER — POLYETHYLENE GLYCOL 3350 17 G PO PACK
17.0000 g | PACK | Freq: Every day | ORAL | Status: DC
  Administered 2019-11-30 – 2019-12-02 (×3): 17 g via ORAL
  Filled 2019-11-30 (×3): qty 1

## 2019-11-30 MED ORDER — LIDOCAINE HCL (PF) 1 % IJ SOLN
INTRAMUSCULAR | Status: AC
  Filled 2019-11-30: qty 30

## 2019-11-30 MED ORDER — MORPHINE SULFATE (PF) 2 MG/ML IV SOLN
2.0000 mg | INTRAVENOUS | Status: DC | PRN
  Administered 2019-11-30 – 2019-12-02 (×3): 2 mg via INTRAVENOUS
  Filled 2019-11-30 (×3): qty 1

## 2019-11-30 NOTE — Procedures (Signed)
PROCEDURE SUMMARY:  Successful US guided paracentesis from right lateral abdomen.  Yielded 1.1 liters of clear yellow fluid.  No immediate complications.  Patient tolerated well.  EBL = trace  Specimen was sent for labs.  Joash Tony S Ermal Brzozowski PA-C 11/30/2019 11:36 AM

## 2019-11-30 NOTE — Progress Notes (Signed)
Received patient to floor from IR. Bandaid to RLQ clean, dry and intact.

## 2019-11-30 NOTE — Consult Note (Signed)
Consultation Note Date: 11/30/2019   Patient Name: Eddie Zhang  DOB: 12/12/1980  MRN: [id number]  Age / Sex: 39 y.o., male  PCP: Patient, No Pcp Per Referring Physician: Caren Griffins, MD  Reason for Consultation: Establishing goals of care  HPI/Patient Profile: 39 y.o. male  with past medical history of alcohol use who was admitted on 11/28/2019 with abdominal swelling.  His hgb was 5.9.  Imaging revealed a sigmoid mass, infiltrative masses thru out the liver and lymphadenopathy in the periaortic, retroperitoneum, and bilateral inguinal areas.   Clinical Assessment and Goals of Care:  I have reviewed medical records including EPIC notes, labs and imaging, received report from Dr. Cruzita Zhang, examined the patient and met at bedside with the patient, his friend Mr. Eddie Zhang, Dr. Cruzita Zhang and the [place] interpreter to discuss diagnosis prognosis, [place], EOL wishes, disposition and options.  After we introduced ourselves and the interpreter we asked Mr. Eddie Zhang if there was any family that he wanted to have participate in the conversation in addition to Mr. Eddie Zhang.  Eddie Zhang politely declined.  We discussed his current illness and what it means in the larger context of his on-going co-morbidities.  Natural disease trajectory and expectations at EOL were discussed.  Specifically Dr. Cruzita Zhang explained that he has a fast growing tumor obstructing his colon.  He also has spread to the liver, gut and lymph nodes.  His blood counts are very low.  Without blood transfusions surgery and chemo are not an option as they would cause him to die even faster than the cancer.    We asked the patient his understanding of what Dr. Cruzita Zhang had told him.  He replied that he understood and wanted to live as long as possible.  He did not want blood transfusions but he did want chemo.  Dr. Cruzita Zhang explained why that was not possible.  I  added that without blood transfusions we will offer him support in his home thru hospice.  A nurse could come to the house and help him, keep him comfortable, but he would likely die in weeks to possibly months.  With blood transfusions we may be able to help him prolong his life by months or perhaps a year.    Mr. Eddie Zhang asked questions about using things other than blood.  Dr. Cruzita Zhang responded stating that we could offer iron, EPO, etc but these things take time to create more red blood cells and unfortunately Mr. Eddie Zhang does not have time.  Mr. Eddie Zhang added on [place] Boast (sp?).  Dr. Cruzita Zhang asked Mr. Eddie Zhang for permission to speak with Mr. Eddie Zhang.  Mr. Eddie Zhang asked further questions about the situation and why we were pressuring Mr. Eddie Zhang to take blood transfusions.  We responded that we were merely presenting the information.  Mr. Eddie Zhang then explained the situation to Eddie Zhang and Eddie Zhang and suggested that the patient consider taking the offer of Hospice and dying naturally without breaking God's laws.    The patient had received quite a lot of information so  I suggested that we take a break for now and talk again tomorrow.  The patient agreed.   Primary Decision Maker:  PATIENT    SUMMARY OF RECOMMENDATIONS    PMT will follow with you.  Will plan to meet with Mr. Eddie Zhang on 5/3 to gather more information about his decision and offer hospice services.  Will need to discuss code status as well.  Code Status/Advance Care Planning:  Full  Symptom Management:   Paracentesis completed today.    Will add morphine PRN  Will add miralax daily to help liquify stools  Minimize blood draws.  Additional Recommendations (Limitations, Scope, Preferences):  Full Scope Treatment  Palliative Prophylaxis:   Frequent Pain Assessment  Psycho-social/Spiritual:   Desire for further Chaplaincy support: Will request  Prognosis:   Weeks to at best a month.  Given his very low hgb and  bleeding colon cancer I fear his hgb will continue to drop quickly.  He has steadfastly refused blood products.  Discharge Planning: To Be Determined      Primary Diagnoses: Present on Admission: . Hypochromic anemia . Other ascites . Elevated liver enzymes   I have reviewed the medical record, interviewed the patient and family, and examined the patient. The following aspects are pertinent.  Past Medical History:  Diagnosis Date  . ETOH abuse    sober since may 2020.     Social History   Socioeconomic History  . Marital status: Single    Spouse name: Not on file  . Number of children: Not on file  . Years of education: Not on file  . Highest education level: Not on file  Occupational History  . Not on file  Tobacco Use  . Smoking status: Not on file  Substance and Sexual Activity  . Alcohol use: Not Currently    Comment: Quit consuming alcohol in 11/2018.  Was drinking 812 ounce cans of beer daily.  . Drug use: Not Currently    Types: Cocaine    Comment: Years ago used to snort cocaine, never injected.  Marland Kitchen Sexual activity: Not on file  Other Topics Concern  . Not on file  Social History Narrative  . Not on file   Social Determinants of Health   Financial Resource Strain:   . Difficulty of Paying Living Expenses:   Food Insecurity:   . Worried About Charity fundraiser in the Last Year:   . Arboriculturist in the Last Year:   Transportation Needs:   . Film/video editor (Medical):   Marland Kitchen Lack of Transportation (Non-Medical):   Physical Activity:   . Days of Exercise per Week:   . Minutes of Exercise per Session:   Stress:   . Feeling of Stress :   Social Connections:   . Frequency of Communication with Friends and Family:   . Frequency of Social Gatherings with Friends and Family:   . Attends Religious Services:   . Active Member of Clubs or Organizations:   . Attends Archivist Meetings:   Marland Kitchen Marital Status:    Family History  Problem  Relation Age of Onset  . Colon cancer Neg Hx    Scheduled Meds: . darbepoetin (ARANESP) injection - NON-DIALYSIS  40 mcg Subcutaneous Weekly  . ferrous sulfate  325 mg Oral TID WC  . sodium chloride flush  3 mL Intravenous Q12H   Continuous Infusions: PRN Meds:.acetaminophen **OR** acetaminophen, albuterol, ondansetron **OR** ondansetron (ZOFRAN) IV No Known Allergies   Vital Signs:  BP 117/63 (BP Location: Left Arm)   Pulse 87   Temp 98.8 F (37.1 C) (Oral)   Resp 17   Ht 5' 7"  (1.702 m)   Wt 83.5 kg   SpO2 99%   BMI 28.83 kg/m  Pain Scale: 0-10   Pain Score: 0-No pain   SpO2: SpO2: 99 % O2 Device:SpO2: 99 % O2 Flow Rate: .   IO: Intake/output summary:   Intake/Output Summary (Last 24 hours) at 11/30/2019 1021 Last data filed at 11/30/2019 0555 Gross per 24 hour  Intake 590 ml  Output --  Net 590 ml    LBM: Last BM Date: 11/29/19 Baseline Weight: Weight: 83.5 kg Most recent weight: Weight: 83.5 kg     Palliative Assessment/Data:  40%     Time In: 1:00 Time Out: 2:00 Time Total: 60 min. Visit consisted of counseling and education dealing with the complex and emotionally intense issues surrounding the need for palliative care and symptom management in the setting of serious and potentially life-threatening illness. Greater than 50%  of this time was spent counseling and coordinating care related to the above assessment and plan.  Signed by: Florentina Jenny, PA-C Palliative Medicine  Please contact Palliative Medicine Team phone at [phone] for questions and concerns.  For individual provider: See Shea Evans

## 2019-11-30 NOTE — Progress Notes (Addendum)
PROGRESS NOTE  Eddie Zhang [id number] DOB: [birth year] DOA: 11/28/2019 PCP: Patient, No Pcp Per   LOS: 2 days   Brief Narrative / Interim history: 39 year old male with history of alcohol abuse, quit in May 2020, presents to the hospital with worsening abdominal swelling for the past 2-1/2 months. He reports significant weight loss in the last several months, when he quit drinking weighing 225 pounds but he is down to 184. He also has noticed intermittent blood in stools over the last couple of months. He has noticed abdominal distention, tightness, leg swelling, poor appetite, generalized malaise and difficulties breathing. He is also Jehovah's Witness and is refusing blood products. In the ED he was found to have probable colon cancer with liver metastasis. GI consulted  Subjective / 24h Interval events: Complains of abdominal fullness, no nausea/vomiting. Also with lower abdominal pain  Assessment & Plan: Principal Problem Colon cancer with liver metastasis-patient was admitted to the hospital with presumed diagnosis of colon cancer with liver metastasis.  Gastroenterology consulted and he is status post flexible sigmoidoscopy on 5/1 with findings of nearly completely obstructing large mass in the distal sigmoid colon.  Biopsies were taken.  Case was discussed with Dr. Alvy Bimler over the phone, and she told me that he is not a candidate for chemotherapy with a hemoglobin of 5.8 and refusal for blood transfusions -Paracentesis today for comfort purposes.  We will send cytology  Active Problems Acute on chronic blood loss anemia-patient has been having intermittent lower GI bleed probably from the colon cancer. He is a Sales promotion account executive Witness, I have extensively discussed at bedside with the patient and he is determined not to receive any blood products.  Monitor H&H daily  Goals of care-Marianne Delinger PA with palliative, myself, patient and his friend Nolene Bernheim had a meeting today.  We discussed  the fact that he is not a candidate for chemotherapy given hemoglobin of 5.8 and inability to transfuse if needed, and also an ability to have any form of surgery done--not that it is indicated at this point--as both chemotherapy and surgery may actually shorten his life.  He expressed understanding.  He will think about everything and readdress tomorrow  Elevated LFTs-due to liver mets  Scheduled Meds: . darbepoetin (ARANESP) injection - NON-DIALYSIS  40 mcg Subcutaneous Weekly  . ferrous sulfate  325 mg Oral TID WC  . lidocaine (PF)      . sodium chloride flush  3 mL Intravenous Q12H   Continuous Infusions: PRN Meds:.acetaminophen **OR** acetaminophen, albuterol, ondansetron **OR** ondansetron (ZOFRAN) IV  DVT prophylaxis: SCDs Code Status: Full code Family Communication: d/w patient and friend Nolene Bernheim at bedside  Status is: Inpatient  Remains inpatient appropriate because:Ongoing diagnostic testing needed not appropriate for outpatient work up   Dispo: The patient is from: Home              Anticipated d/c is to: Home              Anticipated d/c date is: 3 days              Patient currently is not medically stable to d/c.  Consultants:  GI Oncology   Procedures:  None   Microbiology  None   Antimicrobials: None     Objective: Vitals:   11/30/19 1120 11/30/19 1125 11/30/19 1201 11/30/19 1328  BP: 106/66 108/60 107/60 (!) 172/86  Pulse:   86 66  Resp:   17 17  Temp:   98.6 F (37 C) 97.6 F (  36.4 C)  TempSrc:   Oral Oral  SpO2:   99% 99%  Weight:      Height:        Intake/Output Summary (Last 24 hours) at 11/30/2019 1437 Last data filed at 11/30/2019 1351 Gross per 24 hour  Intake 480 ml  Output --  Net 480 ml   Filed Weights   11/28/19 1140 11/29/19 1137  Weight: 83.5 kg 83.5 kg    Examination:  Constitutional: Cachectic, ill-appearing Eyes: No scleral icterus ENMT: mmm Neck: normal, supple Respiratory: CTA biL Cardiovascular: RRR. 2+  pitting edema Abdomen: distended abdomen, BS+ Musculoskeletal: no clubbing / cyanosis.  Skin: no rashes Neurologic: non focal   Data Reviewed: I have independently reviewed following labs and imaging studies   CBC: Recent Labs  Lab 11/28/19 1001 11/29/19 0906 11/30/19 0302  WBC 8.4  --  9.4  NEUTROABS 5.7  --   --   HGB 5.9* 5.8* 5.9*  HCT 21.7* 20.8* 21.1*  MCV 81.6  --  80.5  PLT 323  --  123456   Basic Metabolic Panel: Recent Labs  Lab 11/28/19 1001 11/30/19 0302  NA 136 138  K 3.8 3.6  CL 102 103  CO2 27 26  GLUCOSE 93 87  BUN 9 8  CREATININE 0.59* 0.62  CALCIUM 8.1* 8.3*  MG  --  2.1  PHOS  --  5.0*   Liver Function Tests: Recent Labs  Lab 11/28/19 1001 11/30/19 0302  AST 65* 73*  ALT 34 41  ALKPHOS 400* 390*  BILITOT 0.8 0.9  PROT 6.5 6.2*  ALBUMIN 2.5* 2.6*   Coagulation Profile: Recent Labs  Lab 11/28/19 1001  INR 1.1   HbA1C: No results for input(s): HGBA1C in the last 72 hours. CBG: No results for input(s): GLUCAP in the last 168 hours.  Recent Results (from the past 240 hour(s))  Respiratory Panel by RT PCR (Flu A&B, Covid) - Nasopharyngeal Swab     Status: None   Collection Time: 11/29/19  9:25 AM   Specimen: Nasopharyngeal Swab  Result Value Ref Range Status   SARS Coronavirus 2 by RT PCR NEGATIVE NEGATIVE Final    Comment: (NOTE) SARS-CoV-2 target nucleic acids are NOT DETECTED. The SARS-CoV-2 RNA is generally detectable in upper respiratoy specimens during the acute phase of infection. The lowest concentration of SARS-CoV-2 viral copies this assay can detect is 131 copies/mL. A negative result does not preclude SARS-Cov-2 infection and should not be used as the sole basis for treatment or other patient management decisions. A negative result may occur with  improper specimen collection/handling, submission of specimen other than nasopharyngeal swab, presence of viral mutation(s) within the areas targeted by this assay, and  inadequate number of viral copies (<131 copies/mL). A negative result must be combined with clinical observations, patient history, and epidemiological information. The expected result is Negative. Fact Sheet for Patients:  PinkCheek.be Fact Sheet for Healthcare Providers:  GravelBags.it This test is not yet ap proved or cleared by the [place] FDA and  has been authorized for detection and/or diagnosis of SARS-CoV-2 by FDA under an Emergency Use Authorization (EUA). This EUA will remain  in effect (meaning this test can be used) for the duration of the COVID-19 declaration under Section 564(b)(1) of the Act, 21 U.S.C. section 360bbb-3(b)(1), unless the authorization is terminated or revoked sooner.    Influenza A by PCR NEGATIVE NEGATIVE Final   Influenza B by PCR NEGATIVE NEGATIVE Final    Comment: (NOTE) The  Xpert Xpress SARS-CoV-2/FLU/RSV assay is intended as an aid in  the diagnosis of influenza from Nasopharyngeal swab specimens and  should not be used as a sole basis for treatment. Nasal washings and  aspirates are unacceptable for Xpert Xpress SARS-CoV-2/FLU/RSV  testing. Fact Sheet for Patients: PinkCheek.be Fact Sheet for Healthcare Providers: GravelBags.it This test is not yet approved or cleared by the [place] FDA and  has been authorized for detection and/or diagnosis of SARS-CoV-2 by  FDA under an Emergency Use Authorization (EUA). This EUA will remain  in effect (meaning this test can be used) for the duration of the  Covid-19 declaration under Section 564(b)(1) of the Act, 21  U.S.C. section 360bbb-3(b)(1), unless the authorization is  terminated or revoked. Performed at [facility], [place] [address] [place], [place] [address]      Radiology Studies: US Paracentesis  Result Date: 11/30/2019 INDICATION: Abdominal distension  secondary to hepatic masses and ascites which is worrisome for metastatic colon cancer. Request for diagnostic and therapeutic paracentesis. EXAM: ULTRASOUND GUIDED PARACENTESIS MEDICATIONS: 1% lidocaine 10 mL COMPLICATIONS: None immediate. PROCEDURE: Informed written consent was obtained from the patient after a discussion of the risks, benefits and alternatives to treatment. A timeout was performed prior to the initiation of the procedure. Initial ultrasound scanning demonstrates a small amount of ascites within the right lower abdominal quadrant. The right lower abdomen was prepped and draped in the usual sterile fashion. 1% lidocaine was used for local anesthesia. Following this, a 19 gauge, 7-cm, Yueh catheter was introduced. An ultrasound image was saved for documentation purposes. The paracentesis was performed. The catheter was removed and a dressing was applied. The patient tolerated the procedure well without immediate post procedural complication. FINDINGS: A total of approximately 1.1 L of clear yellow fluid was removed. Samples were sent to the laboratory as requested by the clinical team. IMPRESSION: Successful ultrasound-guided paracentesis yielding 1.1 liters of peritoneal fluid. Read by: Gareth Eagle, PA-C Electronically Signed   By: Aletta Edouard M.D.   On: 11/30/2019 11:35   Time spent: 45 minutes, more than 50% bedside discussing with patient, his friend, and palliative care team regarding treatment options and caveats and also that even with treatment his cancer is quite advanced and not curable  Marzetta Board, MD, PhD [facility]  Between 7 am - 7 pm I am available, please contact me via Amion or Securechat  Between 7 pm - 7 am I am not available, please contact night coverage MD/APP via Amion

## 2019-11-30 NOTE — Plan of Care (Signed)
  Problem: Education: Goal: Knowledge of General Education information will improve Description Including pain rating scale, medication(s)/side effects and non-pharmacologic comfort measures Outcome: Progressing   

## 2019-11-30 NOTE — Progress Notes (Addendum)
[facility] ROUNDING NOTE   Subjective: No acute events overnight.  Flexible sigmoidoscopy completed yesterday and notable for obstructing mass in the distal sigmoid colon, located 18 cm from the anal verge.  Tolerating full liquids today.  Objective: Vital signs in last 24 hours: Temp:  [98.2 F (36.8 C)-99.8 F (37.7 C)] 98.8 F (37.1 C) (05/02 1014) Pulse Rate:  [81-98] 87 (05/02 1014) Resp:  [10-17] 17 (05/02 1014) BP: (102-119)/(49-68) 117/63 (05/02 1014) SpO2:  [95 %-100 %] 99 % (05/02 1014) Weight:  [83.5 kg] 83.5 kg (05/01 1137) Last BM Date: 11/29/19 General: NAD.  Temporal wasting, extremity muscle wasting/sarcopenia Lungs: CTA b/l, no w/r/r Heart:  RRR, no m/r/g Abdomen: Palpable masses in the upper abdomen and in the right hemiabdomen, abdominal distention, minimal tenderness palpation without rebound or guarding.  No peritoneal signs.  +BS Ext: Bilateral lower extremity edema    Intake/Output from previous day: 05/01 0701 - 05/02 0700 In: 10 [P.O.:240; I.V.:100; IV Piggyback:250] Out: -  Intake/Output this shift: No intake/output data recorded.   Lab Results: Recent Labs    11/28/19 1001 11/29/19 0906 11/30/19 0302  WBC 8.4  --  9.4  HGB 5.9* 5.8* 5.9*  PLT 323  --  358  MCV 81.6  --  80.5   BMET Recent Labs    11/28/19 1001 11/30/19 0302  NA 136 138  K 3.8 3.6  CL 102 103  CO2 27 26  GLUCOSE 93 87  BUN 9 8  CREATININE 0.59* 0.62  CALCIUM 8.1* 8.3*   LFT Recent Labs    11/28/19 1001 11/30/19 0302  PROT 6.5 6.2*  ALBUMIN 2.5* 2.6*  AST 65* 73*  ALT 34 41  ALKPHOS 400* 390*  BILITOT 0.8 0.9   PT/INR Recent Labs    11/28/19 1001  INR 1.1      Imaging/Other results: CT ABDOMEN PELVIS W CONTRAST  Result Date: 11/28/2019 CLINICAL DATA:  Abdominal distension. History of cirrhosis. EXAM: CT ABDOMEN AND PELVIS WITH CONTRAST TECHNIQUE: Multidetector CT imaging of the abdomen and pelvis was performed using the standard  protocol following bolus administration of intravenous contrast. CONTRAST:  16mL OMNIPAQUE IOHEXOL 300 MG/ML  SOLN COMPARISON:  None. FINDINGS: Lower chest: No acute abnormality. Hepatobiliary: Liver is markedly enlarged. Infiltrative masses are seen throughout the liver, majority are difficult to measure due to poorly defined margins, largest measurable lesion within the inferior aspects of the LEFT liver lobe (segment 4B), measuring approximately 6 cm greatest dimension. Gallbladder is decompressed. No bile duct dilatation seen. Pancreas: Grossly normal. Spleen: Unremarkable. Adrenals/Urinary Tract: RIGHT kidney is compressed/displaced by the enlarged RIGHT hepatic lobe, but appears otherwise unremarkable without suspicious mass, stone or hydronephrosis. LEFT kidney is unremarkable without mass, stone or hydronephrosis. Bladder is decompressed. Stomach/Bowel: Irregular thickening of the walls of the sigmoid colon, highly suspicious for annular constricting apple-core neoplastic mass. Fairly large amount of stool throughout the more proximal portion of the colon which is nondistended. No dilated small bowel loops. Vascular/Lymphatic: No acute appearing vascular abnormality. Conglomerate lymphadenopathy within the upper periaortic retroperitoneum,, measuring approximately 2 cm short axis dimension (series 3, image 44). Suspect additional retroperitoneal and mesenteric lymphadenopathy, obscured by adjacent structures. Additional mildly prominent lymph nodes within the bilateral inguinal regions. Reproductive: Prostate is unremarkable. Other: Moderate amount of ascites within the abdomen and pelvis. Musculoskeletal: No acute or suspicious osseous finding. Diffuse anasarca. IMPRESSION: 1. Irregular thickening of the walls of the sigmoid colon (series 3, image 84), with an annular constricting "apple-core" appearance, highly  suspicious for colon cancer. 2. Infiltrative masses throughout the liver, with associated  marked hepatomegaly, majority of the liver lesions being difficult to measure due to poorly defined margins, largest measurable lesion within the inferior aspects of the LEFT liver lobe (segment 4B), almost certainly metastatic in etiology. 3. Conglomerate lymphadenopathy within the upper periaortic retroperitoneum, measuring approximately 2 cm short axis dimension, highly suspicious for additional metastatic disease. Additional mildly prominent lymph nodes within the bilateral inguinal regions. Suspect additional retroperitoneal and mesenteric lymphadenopathy obscured by adjacent structures. 4. Moderate amount of ascites within the abdomen and pelvis. 5. Diffuse anasarca. Electronically Signed   By: Franki Cabot M.D.   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  Widely metastatic disease involving the liver, lymphadenopathy, and moderate ascites.  Has had significant weight loss with muscle wasting, temporal wasting, and sarcopenia.  Also presents with significant iron deficiency anemia, with Hgb 5.9.  Complicating matters, he is a Sales promotion account executive Witness and declines any blood products.  This in turn also complicates potential interventions available to him.  -Was given dose of IV Feraheme -Declines any blood products -Daily H/H checks for now -Path pending -Agree with Palliative Care consultation.  He tells me this is scheduled for 1230 today -GI service will sign off at this time.  Please not hesitate to contact on-call GI with any additional questions or concerns.    Lavena Bullion, DO  11/30/2019, 11:27 AM [facility] Pager [phone]

## 2019-12-01 DIAGNOSIS — D649 Anemia, unspecified: Secondary | ICD-10-CM

## 2019-12-01 DIAGNOSIS — R180 Malignant ascites: Secondary | ICD-10-CM

## 2019-12-01 DIAGNOSIS — Z66 Do not resuscitate: Secondary | ICD-10-CM

## 2019-12-01 MED ORDER — TRAZODONE HCL 50 MG PO TABS: 25.0000 mg | ORAL_TABLET | Freq: Every evening | ORAL | Status: DC | PRN

## 2019-12-01 MED ORDER — LORAZEPAM 2 MG/ML IJ SOLN: 1.0000 mg | INTRAMUSCULAR | Status: DC | PRN

## 2019-12-01 MED ORDER — BIOTENE DRY MOUTH MT LIQD: 15.0000 mL | OROMUCOSAL | Status: DC | PRN

## 2019-12-01 MED ORDER — ONDANSETRON HCL 4 MG/2ML IJ SOLN: 4.0000 mg | Freq: Four times a day (QID) | INTRAMUSCULAR | Status: DC | PRN

## 2019-12-01 MED ORDER — SODIUM CHLORIDE 0.9% FLUSH: 3.0000 mL | INTRAVENOUS | Status: DC | PRN

## 2019-12-01 MED ORDER — LORAZEPAM 2 MG/ML PO CONC: 1.0000 mg | ORAL | Status: DC | PRN

## 2019-12-01 MED ORDER — GLYCOPYRROLATE 0.2 MG/ML IJ SOLN: 0.2000 mg | INTRAMUSCULAR | Status: DC | PRN

## 2019-12-01 MED ORDER — ONDANSETRON 4 MG PO TBDP: 4.0000 mg | ORAL_TABLET | Freq: Four times a day (QID) | ORAL | Status: DC | PRN

## 2019-12-01 MED ORDER — HALOPERIDOL LACTATE 5 MG/ML IJ SOLN: 0.5000 mg | INTRAMUSCULAR | Status: DC | PRN

## 2019-12-01 MED ORDER — HALOPERIDOL LACTATE 2 MG/ML PO CONC
0.5000 mg | ORAL | Status: DC | PRN
  Filled 2019-12-01: qty 0.3

## 2019-12-01 MED ORDER — SODIUM CHLORIDE 0.9% FLUSH
3.0000 mL | Freq: Two times a day (BID) | INTRAVENOUS | Status: DC
  Administered 2019-12-02: 3 mL via INTRAVENOUS

## 2019-12-01 MED ORDER — GLYCOPYRROLATE 1 MG PO TABS
1.0000 mg | ORAL_TABLET | ORAL | Status: DC | PRN
  Filled 2019-12-01: qty 1

## 2019-12-01 MED ORDER — POLYVINYL ALCOHOL 1.4 % OP SOLN
1.0000 [drp] | Freq: Four times a day (QID) | OPHTHALMIC | Status: DC | PRN
  Filled 2019-12-01: qty 15

## 2019-12-01 MED ORDER — SODIUM CHLORIDE 0.9 % IV SOLN: 250.0000 mL | INTRAVENOUS | Status: DC | PRN

## 2019-12-01 MED ORDER — HALOPERIDOL 0.5 MG PO TABS
0.5000 mg | ORAL_TABLET | ORAL | Status: DC | PRN
  Filled 2019-12-01: qty 1

## 2019-12-01 MED ORDER — POLYETHYLENE GLYCOL 3350 17 G PO PACK: 17.0000 g | PACK | Freq: Two times a day (BID) | ORAL | Status: DC | PRN

## 2019-12-01 MED ORDER — MORPHINE SULFATE (CONCENTRATE) 10 MG/0.5ML PO SOLN: 5.0000 mg | ORAL | Status: DC | PRN

## 2019-12-01 MED ORDER — LORAZEPAM 1 MG PO TABS: 1.0000 mg | ORAL_TABLET | ORAL | Status: DC | PRN

## 2019-12-01 NOTE — Evaluation (Addendum)
Occupational Therapy Evaluation Patient Details Name: Eddie Zhang MRN: [id number] DOB: 1981-07-27 Today's Date: 12/01/2019    History of Present Illness 39 year old male with history of alcohol abuse, quit in May 2020, presents to the hospital with worsening abdominal swelling for the past 2-1/2 months. He reports significant weight loss in the last several months, when he quit drinking weighing 225 pounds but he is down to 184. He also has noticed intermittent blood in stools over the last couple of months. He has noticed abdominal distention, tightness, leg swelling, poor appetite, generalized malaise and difficulties breathing. He is also Jehovah's Witness and is refusing blood products. In the ED he was found to have probable colon cancer with liver metastasis.   Clinical Impression   PTA patient independent.  Admitted for above and limited by problem list below, including generalized weakness, abdominal edema and pain, decreased activity tolerance.  Patient currently requires supervision for transfers and in room mobility, min assist for LB ADls and supervision for grooming/UB ADLs.  Educated on DME recommendations, safety and ADl compensatory techniques.  He lives with his cousin, who works during the day.  He will benefit from continued OT services while admitted to optimize independence, safety and ADL participation as tolerated.  After dc home, no further OT services needed.     Follow Up Recommendations  No OT follow up;Supervision - Intermittent    Equipment Recommendations  3 in 1 bedside commode;Tub/shower seat;Other (comment)(RW)    Recommendations for Other Services       Precautions / Restrictions Precautions Precautions: Fall Precaution Comments: 5.9 Hgb. Refusing blood products. Restrictions Weight Bearing Restrictions: No      Mobility Bed Mobility Overal bed mobility: Needs Assistance Bed Mobility: Supine to Sit;Sit to Supine     Supine to sit: Min assist;HOB  elevated Sit to supine: Supervision;HOB elevated   General bed mobility comments: assist to elevate trunk, increased time  Transfers Overall transfer level: Needs assistance Equipment used: None Transfers: Sit to/from Stand Sit to Stand: Supervision         General transfer comment: supervision for safety    Balance Overall balance assessment: No apparent balance deficits (not formally assessed)                                         ADL either performed or assessed with clinical judgement   ADL Overall ADL's : Needs assistance/impaired     Grooming: Supervision/safety;Standing   Upper Body Bathing: Set up;Sitting   Lower Body Bathing: Minimal assistance;Sit to/from stand   Upper Body Dressing : Set up;Sitting   Lower Body Dressing: Minimal assistance;Sit to/from stand   Toilet Transfer: Supervision/safety;Ambulation Toilet Transfer Details (indicate cue type and reason): simulated in room, pt declines to transfer off commode due to height and difficulty          Functional mobility during ADLs: Supervision/safety General ADL Comments: pt limited by abdominal girth and pain, generalized weakness and decreased activity tolerance     Vision   Vision Assessment?: No apparent visual deficits     Perception     Praxis      Pertinent Vitals/Pain Pain Assessment: Faces Faces Pain Scale: Hurts little more Pain Location: abdomen Pain Descriptors / Indicators: Discomfort Pain Intervention(s): Limited activity within patient's tolerance;Monitored during session;Repositioned     Hand Dominance     Extremity/Trunk Assessment Upper Extremity Assessment Upper Extremity Assessment: Generalized  weakness   Lower Extremity Assessment Lower Extremity Assessment: Defer to PT evaluation   Cervical / Trunk Assessment Cervical / Trunk Assessment: Normal   Communication Communication Communication: Prefers language other than English(speaks english,  interpreter for complex medical conversation)   Cognition Arousal/Alertness: Awake/alert Behavior During Therapy: WFL for tasks assessed/performed Overall Cognitive Status: Within Functional Limits for tasks assessed                                     General Comments       Exercises     Shoulder Instructions      Home Living Family/patient expects to be discharged to:: Private residence Living Arrangements: Other relatives(cousin) Available Help at Discharge: Family;Available PRN/intermittently(cousin works during the day) Type of Home: House Home Access: Stairs to enter Technical brewer of Steps: 5-6 Entrance Stairs-Rails: Left Home Layout: One level     Bathroom Shower/Tub: Teacher, early years/pre: Standard     Home Equipment: None          Prior Functioning/Environment Level of Independence: Independent                 OT Problem List: Decreased strength;Decreased activity tolerance;Decreased knowledge of use of DME or AE;Decreased knowledge of precautions;Pain      OT Treatment/Interventions: Self-care/ADL training;DME and/or AE instruction;Therapeutic activities;Patient/family education;Energy conservation    OT Goals(Current goals can be found in the care plan section) Acute Rehab OT Goals Patient Stated Goal: not stated Time For Goal Achievement: 12/15/19 Potential to Achieve Goals: Good  OT Frequency: Min 2X/week   Barriers to D/C:            Co-evaluation PT/OT/SLP Co-Evaluation/Treatment: Yes Reason for Co-Treatment: Complexity of the patient's impairments (multi-system involvement);For patient/therapist safety PT goals addressed during session: Mobility/safety with mobility;Balance;Proper use of DME OT goals addressed during session: ADL's and self-care      AM-PAC OT "6 Clicks" Daily Activity     Outcome Measure Help from another person eating meals?: None Help from another person taking care of  personal grooming?: A Little Help from another person toileting, which includes using toliet, bedpan, or urinal?: A Little Help from another person bathing (including washing, rinsing, drying)?: A Little Help from another person to put on and taking off regular upper body clothing?: A Little Help from another person to put on and taking off regular lower body clothing?: A Little 6 Click Score: 19   End of Session Nurse Communication: Mobility status  Activity Tolerance: Patient tolerated treatment well Patient left: in bed;with call bell/phone within reach;with family/visitor present  OT Visit Diagnosis: Pain;Muscle weakness (generalized) (M62.81) Pain - part of body: (abd)                Time: 1048-1100 OT Time Calculation (min): 12 min Charges:  OT General Charges $OT Visit: 1 Visit OT Evaluation $OT Eval Low Complexity: 1 Low  Jolaine Artist, OT Acute Rehabilitation Services Pager [phone] Office [phone]    Delight Stare 12/01/2019, 12:33 PM

## 2019-12-01 NOTE — Evaluation (Signed)
Physical Therapy Evaluation Patient Details Name: Eddie Zhang MRN: [id number] DOB: 1981/01/11 Today's Date: 12/01/2019   History of Present Illness  39 year old male with history of alcohol abuse, quit in May 2020, presents to the hospital with worsening abdominal swelling for the past 2-1/2 months. He reports significant weight loss in the last several months, when he quit drinking weighing 225 pounds but he is down to 184. He also has noticed intermittent blood in stools over the last couple of months. He has noticed abdominal distention, tightness, leg swelling, poor appetite, generalized malaise and difficulties breathing. He is also Jehovah's Witness and is refusing blood products. In the ED he was found to have probable colon cancer with liver metastasis.    Clinical Impression  Pt admitted with above diagnosis. On eval, pt required min assist bed mobility, supervision transfers and supervision ambulation 40' without AD. He presents with decreased strength and decreased activity tolerance.  Pt currently with functional limitations due to the deficits listed below (see PT Problem List). Pt will benefit from skilled PT to increase their independence and safety with mobility to allow discharge to the venue listed below.  Pt able to manage household mobility at supervision level. PT to follow acutely to assist with maximizing mobility and safety prior to d/c. Strength will continue to diminish as his disease process advances; therefore, recommending RW and 3n1 at d/c.      Follow Up Recommendations No PT follow up;Supervision for mobility/OOB    Equipment Recommendations  Rolling walker with 5" wheels;3in1 (PT)    Recommendations for Other Services       Precautions / Restrictions Precautions Precautions: Fall Precaution Comments: 5.9 Hgb. Refusing blood products. Restrictions Weight Bearing Restrictions: No      Mobility  Bed Mobility Overal bed mobility: Needs Assistance Bed  Mobility: Supine to Sit;Sit to Supine     Supine to sit: Min assist;HOB elevated Sit to supine: Supervision;HOB elevated   General bed mobility comments: assist to elevate trunk, increased time  Transfers Overall transfer level: Needs assistance Equipment used: None Transfers: Sit to/from Stand Sit to Stand: Supervision         General transfer comment: supervision for safety  Ambulation/Gait Ambulation/Gait assistance: Supervision Gait Distance (Feet): 40 Feet Assistive device: None Gait Pattern/deviations: Step-through pattern;Decreased stride length Gait velocity: decreased Gait velocity interpretation: <1.31 ft/sec, indicative of household ambulator General Gait Details: slow, guarded gait; supervision for safety. Pt without c/o dizziness. No overt LOB noted.  Stairs            Wheelchair Mobility    Modified Rankin (Stroke Patients Only)       Balance Overall balance assessment: No apparent balance deficits (not formally assessed)                                           Pertinent Vitals/Pain Pain Assessment: Faces Faces Pain Scale: Hurts little more Pain Location: abdomen Pain Descriptors / Indicators: Discomfort Pain Intervention(s): Repositioned;Limited activity within patient's tolerance;Monitored during session    [place] expects to be discharged to:: Private residence Living Arrangements: Other relatives(cousin) Available Help at Discharge: Family;Available PRN/intermittently(cousin works during the day) Type of Home: House Home Access: Stairs to enter Entrance Stairs-Rails: Horticulturist, commercial of Steps: 5-6 Home Layout: One level Home Equipment: None      Prior Function Level of Independence: Independent  Hand Dominance        Extremity/Trunk Assessment   Upper Extremity Assessment Upper Extremity Assessment: Defer to OT evaluation    Lower Extremity  Assessment Lower Extremity Assessment: Generalized weakness    Cervical / Trunk Assessment Cervical / Trunk Assessment: Normal  Communication   Communication: Prefers language other than English(Speaks English. Interpreter needed for complex medical conversations.)  Cognition Arousal/Alertness: Awake/alert Behavior During Therapy: WFL for tasks assessed/performed Overall Cognitive Status: Within Functional Limits for tasks assessed                                        General Comments      Exercises     Assessment/Plan    PT Assessment Patient needs continued PT services  PT Problem List Decreased strength;Decreased mobility;Decreased activity tolerance;Pain;Decreased knowledge of use of DME       PT Treatment Interventions      PT Goals (Current goals can be found in the Care Plan section)  Acute Rehab PT Goals Patient Stated Goal: not stated PT Goal Formulation: With patient Time For Goal Achievement: 12/15/19 Potential to Achieve Goals: Fair    Frequency Min 2X/week   Barriers to discharge        Co-evaluation PT/OT/SLP Co-Evaluation/Treatment: Yes Reason for Co-Treatment: Complexity of the patient's impairments (multi-system involvement);For patient/therapist safety PT goals addressed during session: Mobility/safety with mobility;Balance;Proper use of DME         AM-PAC PT "6 Clicks" Mobility  Outcome Measure Help needed turning from your back to your side while in a flat bed without using bedrails?: A Little Help needed moving from lying on your back to sitting on the side of a flat bed without using bedrails?: A Little Help needed moving to and from a bed to a chair (including a wheelchair)?: None Help needed standing up from a chair using your arms (e.g., wheelchair or bedside chair)?: None Help needed to walk in hospital room?: None Help needed climbing 3-5 steps with a railing? : A Little 6 Click Score: 21    End of Session    Activity Tolerance: Patient tolerated treatment well Patient left: in bed;with call bell/phone within reach;with family/visitor present Nurse Communication: Mobility status PT Visit Diagnosis: Muscle weakness (generalized) (M62.81);Difficulty in walking, not elsewhere classified (R26.2)    Time: 1046-1100 PT Time Calculation (min) (ACUTE ONLY): 14 min   Charges:   PT Evaluation $PT Eval Low Complexity: 1 Low          Lorrin Goodell, PT  Office # [phone] Pager [phone]   Lorriane Shire 12/01/2019, 12:02 PM

## 2019-12-01 NOTE — Progress Notes (Signed)
Patient ID: Eddie Zhang, male   DOB: Apr 30, 1981, 39 y.o.   MRN: [id number]   Pt with ETOH abuse with ascites First Para 5/2 in IR: 1.1 L yellow fluid - cytology pending Bloody stools x 2 mo Wt loss Abdominal distension and leg swelling Anemia: Hg 5.9 Gastroenterology consulted and he is status post flexible sigmoidoscopy on 5/1 with findings of nearly completely obstructing large mass in the distal sigmoid colon.  Jehovah Witness- refuses blood products- so pt not a candidate for surgery Also not candidate for chemotherapy secondary anemia and refusal for blood   Request made for peritoneal PleurX catheter placement Discussed with Dr Vernard Gambles Not a candidate for PleurX at this time Would need Hx multiple LARGE volume paracentesis And  Home with Hospice documentation  Please re order if pt becomes more appropriate candidate for PleurX procedure

## 2019-12-01 NOTE — Progress Notes (Signed)
PROGRESS NOTE  Eddie Zhang [id number] DOB: September 29, 1980 DOA: 11/28/2019 PCP: Patient, No Pcp Per   LOS: 3 days   Brief Narrative / Interim history: 39 year old male with history of alcohol abuse, quit in May 2020, presents to the hospital with worsening abdominal swelling for the past 2-1/2 months. He reports significant weight loss in the last several months, when he quit drinking weighing 225 pounds but he is down to 184. He also has noticed intermittent blood in stools over the last couple of months. He has noticed abdominal distention, tightness, leg swelling, poor appetite, generalized malaise and difficulties breathing. He is also Jehovah's Witness and is refusing blood products. In the ED he was found to have probable colon cancer with liver metastasis. GI consulted  Subjective / 24h Interval events: Received some pain medications last night which really helped. No nausea/vomiting.  Assessment & Plan: Principal Problem Colon cancer with liver metastasis-patient was admitted to the hospital with presumed diagnosis of colon cancer with liver metastasis.  Gastroenterology consulted and he is status post flexible sigmoidoscopy on 5/1 with findings of nearly completely obstructing large mass in the distal sigmoid colon.  Biopsies were taken.  Case was discussed with Dr. Alvy Bimler over the phone, and she told me that he is not a candidate for chemotherapy with a hemoglobin of 5.8 and refusal for blood transfusions -palliative following, it appears that he will go home with hospice  Active Problems Acute on chronic blood loss anemia-patient has been having intermittent lower GI bleed probably from the colon cancer. He does not wish for blood transfusions  Goals of care-palliative following.  Elevated LFTs-due to liver mets  Scheduled Meds: . darbepoetin (ARANESP) injection - NON-DIALYSIS  40 mcg Subcutaneous Weekly  . ferrous sulfate  325 mg Oral TID WC  . polyethylene glycol  17 g Oral  Daily  . sodium chloride flush  3 mL Intravenous Q12H  . sodium chloride flush  3 mL Intravenous Q12H   Continuous Infusions: . sodium chloride     PRN Meds:.sodium chloride, acetaminophen **OR** acetaminophen, albuterol, antiseptic oral rinse, glycopyrrolate **OR** glycopyrrolate **OR** glycopyrrolate, haloperidol **OR** haloperidol **OR** haloperidol lactate, LORazepam **OR** LORazepam **OR** LORazepam, morphine injection, morphine CONCENTRATE **OR** morphine CONCENTRATE, ondansetron **OR** ondansetron (ZOFRAN) IV, ondansetron **OR** ondansetron (ZOFRAN) IV, polyethylene glycol, polyvinyl alcohol, sodium chloride flush, traZODone  DVT prophylaxis: SCDs Code Status: Full code Family Communication: d/w patient and friend Nolene Bernheim at bedside  Status is: Inpatient  Remains inpatient appropriate because: likely home 24 h once hospice is set up   Dispo: The patient is from: Home              Anticipated d/c is to: Home with hospice              Anticipated d/c date is: 1 day              Patient currently is not medically stable to d/c.  Consultants:  GI Oncology   Procedures:  None   Microbiology  None   Antimicrobials: None     Objective: Vitals:   11/30/19 2047 12/01/19 0530 12/01/19 0534 12/01/19 1417  BP: 110/60 108/62 108/62 (!) 121/58  Pulse: 87 86 83 95  Resp: 16  16 17   Temp: 98.1 F (36.7 C) 98.5 F (36.9 C) 98.5 F (36.9 C) 97.6 F (36.4 C)  TempSrc: Oral Oral Oral Oral  SpO2: 100% 98% 97% 100%  Weight:      Height:  Intake/Output Summary (Last 24 hours) at 12/01/2019 1446 Last data filed at 12/01/2019 0536 Gross per 24 hour  Intake 480 ml  Output --  Net 480 ml   Filed Weights   15-Dec-2019 1140 11/29/19 1137  Weight: 83.5 kg 83.5 kg    Examination:  Constitutional: NAD Respiratory: CTA Cardiovascular: RRR Abdomen: disteded abdomen, BS+  Data Reviewed: I have independently reviewed following labs and imaging studies   CBC: Recent Labs   Lab 12-15-19 1001 11/29/19 0906 11/30/19 0302  WBC 8.4  --  9.4  NEUTROABS 5.7  --   --   HGB 5.9* 5.8* 5.9*  HCT 21.7* 20.8* 21.1*  MCV 81.6  --  80.5  PLT 323  --  123456   Basic Metabolic Panel: Recent Labs  Lab 2019/12/15 1001 11/30/19 0302  NA 136 138  K 3.8 3.6  CL 102 103  CO2 27 26  GLUCOSE 93 87  BUN 9 8  CREATININE 0.59* 0.62  CALCIUM 8.1* 8.3*  MG  --  2.1  PHOS  --  5.0*   Liver Function Tests: Recent Labs  Lab 2019-12-15 1001 11/30/19 0302  AST 65* 73*  ALT 34 41  ALKPHOS 400* 390*  BILITOT 0.8 0.9  PROT 6.5 6.2*  ALBUMIN 2.5* 2.6*   Coagulation Profile: Recent Labs  Lab 12/15/2019 1001  INR 1.1   HbA1C: No results for input(s): HGBA1C in the last 72 hours. CBG: No results for input(s): GLUCAP in the last 168 hours.  Recent Results (from the past 240 hour(s))  Respiratory Panel by RT PCR (Flu A&B, Covid) - Nasopharyngeal Swab     Status: None   Collection Time: 11/29/19  9:25 AM   Specimen: Nasopharyngeal Swab  Result Value Ref Range Status   SARS Coronavirus 2 by RT PCR NEGATIVE NEGATIVE Final    Comment: (NOTE) SARS-CoV-2 target nucleic acids are NOT DETECTED. The SARS-CoV-2 RNA is generally detectable in upper respiratoy specimens during the acute phase of infection. The lowest concentration of SARS-CoV-2 viral copies this assay can detect is 131 copies/mL. A negative result does not preclude SARS-Cov-2 infection and should not be used as the sole basis for treatment or other patient management decisions. A negative result may occur with  improper specimen collection/handling, submission of specimen other than nasopharyngeal swab, presence of viral mutation(s) within the areas targeted by this assay, and inadequate number of viral copies (<131 copies/mL). A negative result must be combined with clinical observations, patient history, and epidemiological information. The expected result is Negative. Fact Sheet for Patients:   PinkCheek.be Fact Sheet for Healthcare Providers:  GravelBags.it This test is not yet ap proved or cleared by the [place] FDA and  has been authorized for detection and/or diagnosis of SARS-CoV-2 by FDA under an Emergency Use Authorization (EUA). This EUA will remain  in effect (meaning this test can be used) for the duration of the COVID-19 declaration under Section 564(b)(1) of the Act, 21 U.S.C. section 360bbb-3(b)(1), unless the authorization is terminated or revoked sooner.    Influenza A by PCR NEGATIVE NEGATIVE Final   Influenza B by PCR NEGATIVE NEGATIVE Final    Comment: (NOTE) The Xpert Xpress SARS-CoV-2/FLU/RSV assay is intended as an aid in  the diagnosis of influenza from Nasopharyngeal swab specimens and  should not be used as a sole basis for treatment. Nasal washings and  aspirates are unacceptable for Xpert Xpress SARS-CoV-2/FLU/RSV  testing. Fact Sheet for Patients: PinkCheek.be Fact Sheet for Healthcare Providers: GravelBags.it This  test is not yet approved or cleared by the [place] and  has been authorized for detection and/or diagnosis of SARS-CoV-2 by  FDA under an Emergency Use Authorization (EUA). This EUA will remain  in effect (meaning this test can be used) for the duration of the  Covid-19 declaration under Section 564(b)(1) of the Act, 21  U.S.C. section 360bbb-3(b)(1), unless the authorization is  terminated or revoked. Performed at [facility], [place] [address] [place], [place] [address]      Radiology Studies: No results found. Time spent: 45 minutes, more than 50% bedside discussing with patient, his friend, and palliative care team regarding treatment options and caveats and also that even with treatment his cancer is quite advanced and not curable  Marzetta Board, MD, PhD [facility]  Between 7 am  - 7 pm I am available, please contact me via Amion or Securechat  Between 7 pm - 7 am I am not available, please contact night coverage MD/APP via Amion

## 2019-12-01 NOTE — Progress Notes (Signed)
Palliative Medicine RN Note: Rec'd request from PMT PA Florentina Jenny to assist with letter; family is trying to get Mr Ballengee mother here for a visit. Letter written, signed by Haynes Dage, tubed to [place] to be given to family.  Marjie Skiff Deasia Chiu, RN, BSN, [facility] Palliative Medicine Team 12/01/2019 2:06 PM Office [phone]

## 2019-12-01 NOTE — Care Management (Signed)
Patient being assessed for [facility]. Being reviewed by [facility] Director. Will await determination.  Eddie Zhang

## 2019-12-01 NOTE — Progress Notes (Signed)
Chaplain responded to a consult for spiritual care. The patient expressed a sense of sadness for those that he will be leaving behind when he dies. He seems very aware of his mortality. The patient is not suicidal or ready to die. The chaplain will watch this case closely and is available to assist if the mother is able to come.  Brion Aliment Chaplain Resident For questions concerning this note please contact me by pager [phone]

## 2019-12-01 NOTE — Care Management (Addendum)
Patient from home with cousin , no PT follow up. Recommending 3 in 1 and tub bench Called Zac with [facility] . If patient goes home with hospice , hospice needs to order DME. If patient does not go home with hospice NCM needs to call back and order.Will await decision regarding home hospice.   Will provide MATCH ( medication assistance ) pending prescriptions and see for PCP.   Continue to follow.

## 2019-12-01 NOTE — Progress Notes (Signed)
Daily Progress Note   Patient Name: Eddie Zhang       Date: 12/01/2019 DOB: 09/23/1980  Age: 39 y.o. MRN#: [id number] Attending Physician: Caren Griffins, MD Primary Care Physician: Patient, No Pcp Per Admit Date: 11/28/2019  Reason for Consultation/Follow-up:  To discuss complex medical decision making related to patient's goals of care  Subjective: Spoke with patient via interpretor Viria.  Patient's good friend (ex-fiance) Neoma Laming is present.    We discussed Hospice services.  Patient is appreciative of offer for Hospice Facility.  He is resolute in his refusal of blood products and has accepted that without transfusion he is unable to have surgery or chemotherapy.    We discussed that the purpose of hospice facility is to have a peaceful comfortable death in a matter of weeks.  Patient understands and accepts.  We discussed that at [facility] if he dies, resuscitation will not be offered.  Patient understands and accepts.     Patient asks if his ex-fiance will be able to stay in the hospital with him and if she will be allowed to visit overnight at [facility] - as she lives 2 hours away.  I advise that she can visit in the hospital with him, and defer to Hospice about their visiting policy.  Patient can stand and walk but is very weak.  He has complete colonic obstruction and asks if we have anything than can help him stool or liquify the stool.  We discussed miralax.    He asks if he can have paracentesis again - anything to relieve pressure in his abdomen.  I will request paracentesis vs placing pleurx via IR.  Patient requests a letter from the hospital to help his mother get an emergency visa to travel from [place].  [place] RN provided letter.   Assessment: Patient with terminal  illness.  Bleeding obstructing colonic tumor.  Metastasis to the liver, retroperitoneum, inguinal adenopathy.  Hgb 5.8.  Patient able to take in full liquids but uncomfortable.  Able to ambulate small distances but weak and unsteady.   Patient Profile/HPI:  39 y.o. male  with past medical history of alcohol use who was admitted on 11/28/2019 with abdominal swelling.  His hgb was 5.9.  Imaging revealed a sigmoid mass, infiltrative masses thru out the liver and lymphadenopathy in the periaortic, retroperitoneum,  and bilateral inguinal areas.      Length of Stay: 3   Vital Signs: BP (!) 121/58 (BP Location: Left Arm)   Pulse 95   Temp 97.6 F (36.4 C) (Oral)   Resp 17   Ht 5\' 7"  (1.702 m)   Wt 83.5 kg   SpO2 100%   BMI 28.83 kg/m  SpO2: SpO2: 100 % O2 Device: O2 Device: Room Air O2 Flow Rate:         Palliative Assessment/Data: 40%     Palliative Care Plan    Recommendations/Plan:  Code status changed to DNR  Recommend Pleurx Cath placement   DC to [facility] when bed available.  PMT to follow for symptoms.  Code Status:  DNR  Prognosis:   < 2 weeks   Discharge Planning:  Hospice facility  Care plan was discussed with [facility] MD, RN, patient  Thank you for allowing the Palliative Medicine Team to assist in the care of this patient.  Total time spent:  60 min.     Greater than 50%  of this time was spent counseling and coordinating care related to the above assessment and plan.  Florentina Jenny, PA-C Palliative Medicine  Please contact Palliative MedicineTeam phone at [phone] for questions and concerns between 7 am - 7 pm.   Please see AMION for individual provider pager numbers.

## 2019-12-01 NOTE — Progress Notes (Signed)
I assisted RN with information about he's plan of care, by Juliann Mule Spanish Medical Interpreter.

## 2019-12-01 NOTE — Progress Notes (Signed)
[facility])  Hospital Liaison Note  Received request from [facility] for hospice services after discharge.  Chart and pt information under review by [facility] physician.  Hospice eligibility pending at this time.    Spoke with pt to initiate education related to hospice philosophy and services and to answer any questions.  Pt verbalized understanding of information given.   [facility] information and contact numbers given.  Above information shared with TOC Heather.  Please call with any questions or concerns.  Thank you for the opportunity to participate in this pt's care.  Domenic Moras, BSN, [employer] (in [phone] (24h on call)

## 2019-12-02 ENCOUNTER — Inpatient Hospital Stay (HOSPITAL_COMMUNITY): Payer: Medicaid Other

## 2019-12-02 DIAGNOSIS — Z515 Encounter for palliative care: Secondary | ICD-10-CM

## 2019-12-02 LAB — CYTOLOGY - NON PAP

## 2019-12-02 MED ORDER — FERROUS SULFATE 325 (65 FE) MG PO TABS: 325.0000 mg | ORAL_TABLET | Freq: Three times a day (TID) | ORAL | 3 refills | Status: AC

## 2019-12-02 MED ORDER — LIDOCAINE HCL 1 % IJ SOLN
INTRAMUSCULAR | Status: AC
  Filled 2019-12-02: qty 20

## 2019-12-02 MED ORDER — MORPHINE SULFATE (CONCENTRATE) 10 MG/0.5ML PO SOLN: 5.0000 mg | ORAL | 0 refills | Status: AC | PRN

## 2019-12-02 MED ORDER — POLYETHYLENE GLYCOL 3350 17 G PO PACK: 17.0000 g | PACK | Freq: Two times a day (BID) | ORAL | 0 refills | Status: AC

## 2019-12-02 MED ORDER — POLYETHYLENE GLYCOL 3350 17 G PO PACK: 17.0000 g | PACK | Freq: Two times a day (BID) | ORAL | Status: DC

## 2019-12-02 MED ORDER — LORAZEPAM 2 MG/ML PO CONC: 1.0000 mg | ORAL | 0 refills | Status: AC | PRN

## 2019-12-02 NOTE — Progress Notes (Signed)
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 This diagnosis was already discussed with the patient immediatley after the procedure, along with his cousin by phone, per patient request. It was again discussed on rounds with the patient the following day. Per notes, planning d/c to residential hospice care.

## 2019-12-02 NOTE — Social Work (Signed)
Pt accepted at [facility]. Pt cousin and pt aware. Pt PTAR arranged, DNR signed on chart as are scripts. Will send summary when available, pt cleared for d/c.  Westley Hummer, MSW, [place] [employer]

## 2019-12-02 NOTE — Progress Notes (Signed)
Report given to Arbie Cookey at [facility]. AVS printed. Family Ceasar and significant other debra advised of transfer. Per Arbie Cookey, IV is to remain in for transfer.

## 2019-12-02 NOTE — Discharge Summary (Signed)
Physician Discharge Summary  Eddie Zhang [id number] DOB: 11-12-1980 DOA: 11/28/2019  PCP: Patient, No Pcp Per  Admit date: 11/28/2019 Discharge date: 12/02/2019  Admitted From: home Disposition:  Residential hospice  Recommendations for Outpatient Follow-up:  Follow up with hospice  Home Health: none Equipment/Devices: none  Discharge Condition: stable CODE STATUS: DNR Diet recommendation: regular as tolerated  HPI: Per admitting MD, Eddie Zhang is a 39 y.o. male with medical history significant of Jehovah's witness and alcohol abuse.  He presents with complaints of significantly worsening abdominal swelling for the last 2 and half months.  History is obtained with assistance of his cousin who is present at bedside.  When he quit drinking in May 2020 patient reported weighing around 225 pounds, but since that time had loss a significant amount of weight and currently reported being down to around 184 pounds.  Over the last couple months he has intermittently had blood in his stools.  Notes associated symptoms of abdominal tightness, leg swelling, poor appetite, generalized malaise, and shortness of breath.  Denies any known family history of liver disease or colon cancer. ED Course: Patient was noted to have relatively stable vital signs.  Labs significant for hemoglobin 5.9, alkaline phosphatase 400, AST 65, ALT 34, and total bilirubin 0.8.  CT scan of the abdomen and pelvis significant for sigmoid colon thickening with apple core sign concerning for malignancy with widespread lymphadenopathy, liver masses, and ascites.  Gastroenterology had been formally consulted.  [facility] called to admit.  Hospital Course / Discharge diagnoses: Principal Problem Colon cancer with liver metastasis-patient was admitted to the hospital with presumed diagnosis of colon cancer with liver metastasis.  Gastroenterology consulted and he is status post flexible sigmoidoscopy on 5/1 with findings of nearly  completely obstructing large mass in the distal sigmoid colon.  Biopsies were taken.  Case was discussed with Dr. Alvy Bimler with oncology over the phone, and she told me that he is not a candidate for chemotherapy with a hemoglobin of 5.8 and refusal for blood transfusions.  Palliative has been consulted, and after discussions patient will be transitioned to full comfort and discharged to residential hospice.  Active Problems Acute on chronic blood loss anemia-patient has been having intermittent lower GI bleed probably from the colon cancer. He does not wish for blood transfusions.  He received IV iron, erythropoietin, and will be discharged on oral iron Elevated LFTs-due to liver mets  Discharge Instructions   Allergies as of 12/02/2019   No Known Allergies     Medication List    TAKE these medications   ferrous sulfate 325 (65 FE) MG tablet Take 1 tablet (325 mg total) by mouth 3 (three) times daily with meals.   LORazepam 2 MG/ML concentrated solution Commonly known as: ATIVAN Place 0.5 mLs (1 mg total) under the tongue every 4 (four) hours as needed for anxiety.   morphine CONCENTRATE 10 MG/0.5ML Soln concentrated solution Take 0.25 mLs (5 mg total) by mouth every 2 (two) hours as needed for moderate pain (or dyspnea).   OVER THE COUNTER MEDICATION Take 500 mg by mouth 2 (two) times daily. "Ampitrexyl" from [place] - High in Antioxidants (Vitamin-C) + Sambucus, Zinc, Echinacea & Bee Propolis to help support the immune system.   polyethylene glycol 17 g packet Commonly known as: MIRALAX / GLYCOLAX Take 17 g by mouth 2 (two) times daily.       Consultations:  GI  Palliative   Procedures/Studies: Flexible sigmoidoscopy   CT ABDOMEN PELVIS W CONTRAST  Result Date: 11/28/2019 CLINICAL DATA:  Abdominal distension. History of cirrhosis. EXAM: CT ABDOMEN AND PELVIS WITH CONTRAST TECHNIQUE: Multidetector CT imaging of the abdomen and pelvis was performed using the standard  protocol following bolus administration of intravenous contrast. CONTRAST:  120mL OMNIPAQUE IOHEXOL 300 MG/ML  SOLN COMPARISON:  None. FINDINGS: Lower chest: No acute abnormality. Hepatobiliary: Liver is markedly enlarged. Infiltrative masses are seen throughout the liver, majority are difficult to measure due to poorly defined margins, largest measurable lesion within the inferior aspects of the LEFT liver lobe (segment 4B), measuring approximately 6 cm greatest dimension. Gallbladder is decompressed. No bile duct dilatation seen. Pancreas: Grossly normal. Spleen: Unremarkable. Adrenals/Urinary Tract: RIGHT kidney is compressed/displaced by the enlarged RIGHT hepatic lobe, but appears otherwise unremarkable without suspicious mass, stone or hydronephrosis. LEFT kidney is unremarkable without mass, stone or hydronephrosis. Bladder is decompressed. Stomach/Bowel: Irregular thickening of the walls of the sigmoid colon, highly suspicious for annular constricting apple-core neoplastic mass. Fairly large amount of stool throughout the more proximal portion of the colon which is nondistended. No dilated small bowel loops. Vascular/Lymphatic: No acute appearing vascular abnormality. Conglomerate lymphadenopathy within the upper periaortic retroperitoneum,, measuring approximately 2 cm short axis dimension (series 3, image 44). Suspect additional retroperitoneal and mesenteric lymphadenopathy, obscured by adjacent structures. Additional mildly prominent lymph nodes within the bilateral inguinal regions. Reproductive: Prostate is unremarkable. Other: Moderate amount of ascites within the abdomen and pelvis. Musculoskeletal: No acute or suspicious osseous finding. Diffuse anasarca. IMPRESSION: 1. Irregular thickening of the walls of the sigmoid colon (series 3, image 84), with an annular constricting "apple-core" appearance, highly suspicious for colon cancer. 2. Infiltrative masses throughout the liver, with associated  marked hepatomegaly, majority of the liver lesions being difficult to measure due to poorly defined margins, largest measurable lesion within the inferior aspects of the LEFT liver lobe (segment 4B), almost certainly metastatic in etiology. 3. Conglomerate lymphadenopathy within the upper periaortic retroperitoneum, measuring approximately 2 cm short axis dimension, highly suspicious for additional metastatic disease. Additional mildly prominent lymph nodes within the bilateral inguinal regions. Suspect additional retroperitoneal and mesenteric lymphadenopathy obscured by adjacent structures. 4. Moderate amount of ascites within the abdomen and pelvis. 5. Diffuse anasarca. Electronically Signed   By: Franki Cabot M.D.   On: 11/28/2019 13:35   US Paracentesis  Result Date: 11/30/2019 INDICATION: Abdominal distension secondary to hepatic masses and ascites which is worrisome for metastatic colon cancer. Request for diagnostic and therapeutic paracentesis. EXAM: ULTRASOUND GUIDED PARACENTESIS MEDICATIONS: 1% lidocaine 10 mL COMPLICATIONS: None immediate. PROCEDURE: Informed written consent was obtained from the patient after a discussion of the risks, benefits and alternatives to treatment. A timeout was performed prior to the initiation of the procedure. Initial ultrasound scanning demonstrates a small amount of ascites within the right lower abdominal quadrant. The right lower abdomen was prepped and draped in the usual sterile fashion. 1% lidocaine was used for local anesthesia. Following this, a 19 gauge, 7-cm, Yueh catheter was introduced. An ultrasound image was saved for documentation purposes. The paracentesis was performed. The catheter was removed and a dressing was applied. The patient tolerated the procedure well without immediate post procedural complication. FINDINGS: A total of approximately 1.1 L of clear yellow fluid was removed. Samples were sent to the laboratory as requested by the clinical  team. IMPRESSION: Successful ultrasound-guided paracentesis yielding 1.1 liters of peritoneal fluid. Read by: Gareth Eagle, PA-C Electronically Signed   By: Aletta Edouard M.D.   On: 11/30/2019 11:35   IR ABDOMEN  US LIMITED  Result Date: 12/02/2019 CLINICAL DATA:  Ascites, metastatic colon cancer, cirrhosis abdominal distension EXAM: LIMITED ABDOMEN ULTRASOUND FOR ASCITES TECHNIQUE: Limited ultrasound survey for ascites was performed in all four abdominal quadrants. COMPARISON:  11/30/2019, 11/28/2019 FINDINGS: Survey of the abdominal 4 quadrants demonstrates only a trace amount of abdominopelvic ascites. Not enough to warrant therapeutic paracentesis. Procedure not performed. IMPRESSION: Trace abdominopelvic ascites by ultrasound Electronically Signed   By: Jerilynn Mages.  Shick M.D.   On: 12/02/2019 13:35      Subjective: Complains of abdominal distention  Discharge Exam: BP (!) 111/59 (BP Location: Right Arm)   Pulse 88   Temp 99.1 F (37.3 C) (Oral)   Resp 17   Ht 5\' 7"  (1.702 m)   Wt 83.5 kg   SpO2 96%   BMI 28.83 kg/m   General: Pt is alert, awake, not in acute distress Cardiovascular: RRR, S1/S2 +, no rubs, no gallops, 2+ edema Respiratory: CTA bilaterally, no wheezing, no rhonchi Abdominal: Distended, large hard masses felt throughout the abdomen   The results of significant diagnostics from this hospitalization (including imaging, microbiology, ancillary and laboratory) are listed below for reference.     Microbiology: Recent Results (from the past 240 hour(s))  Respiratory Panel by RT PCR (Flu A&B, Covid) - Nasopharyngeal Swab     Status: None   Collection Time: 11/29/19  9:25 AM   Specimen: Nasopharyngeal Swab  Result Value Ref Range Status   SARS Coronavirus 2 by RT PCR NEGATIVE NEGATIVE Final    Comment: (NOTE) SARS-CoV-2 target nucleic acids are NOT DETECTED. The SARS-CoV-2 RNA is generally detectable in upper respiratoy specimens during the acute phase of infection. The  lowest concentration of SARS-CoV-2 viral copies this assay can detect is 131 copies/mL. A negative result does not preclude SARS-Cov-2 infection and should not be used as the sole basis for treatment or other patient management decisions. A negative result may occur with  improper specimen collection/handling, submission of specimen other than nasopharyngeal swab, presence of viral mutation(s) within the areas targeted by this assay, and inadequate number of viral copies (<131 copies/mL). A negative result must be combined with clinical observations, patient history, and epidemiological information. The expected result is Negative. Fact Sheet for Patients:  PinkCheek.be Fact Sheet for Healthcare Providers:  GravelBags.it This test is not yet ap proved or cleared by the [place] FDA and  has been authorized for detection and/or diagnosis of SARS-CoV-2 by FDA under an Emergency Use Authorization (EUA). This EUA will remain  in effect (meaning this test can be used) for the duration of the COVID-19 declaration under Section 564(b)(1) of the Act, 21 U.S.C. section 360bbb-3(b)(1), unless the authorization is terminated or revoked sooner.    Influenza A by PCR NEGATIVE NEGATIVE Final   Influenza B by PCR NEGATIVE NEGATIVE Final    Comment: (NOTE) The Xpert Xpress SARS-CoV-2/FLU/RSV assay is intended as an aid in  the diagnosis of influenza from Nasopharyngeal swab specimens and  should not be used as a sole basis for treatment. Nasal washings and  aspirates are unacceptable for Xpert Xpress SARS-CoV-2/FLU/RSV  testing. Fact Sheet for Patients: PinkCheek.be Fact Sheet for Healthcare Providers: GravelBags.it This test is not yet approved or cleared by the [place] FDA and  has been authorized for detection and/or diagnosis of SARS-CoV-2 by  FDA under an Emergency  Use Authorization (EUA). This EUA will remain  in effect (meaning this test can be used) for the duration of the  Covid-19 declaration under  Section 564(b)(1) of the Act, 21  U.S.C. section 360bbb-3(b)(1), unless the authorization is  terminated or revoked. Performed at [facility], [place] [address], [place], [place] [address]      Labs: Basic Metabolic Panel: Recent Labs  Lab 11/28/19 1001 11/30/19 0302  NA 136 138  K 3.8 3.6  CL 102 103  CO2 27 26  GLUCOSE 93 87  BUN 9 8  CREATININE 0.59* 0.62  CALCIUM 8.1* 8.3*  MG  --  2.1  PHOS  --  5.0*   Liver Function Tests: Recent Labs  Lab 11/28/19 1001 11/30/19 0302  AST 65* 73*  ALT 34 41  ALKPHOS 400* 390*  BILITOT 0.8 0.9  PROT 6.5 6.2*  ALBUMIN 2.5* 2.6*   CBC: Recent Labs  Lab 11/28/19 1001 11/29/19 0906 11/30/19 0302  WBC 8.4  --  9.4  NEUTROABS 5.7  --   --   HGB 5.9* 5.8* 5.9*  HCT 21.7* 20.8* 21.1*  MCV 81.6  --  80.5  PLT 323  --  358   CBG: No results for input(s): GLUCAP in the last 168 hours. Hgb A1c No results for input(s): HGBA1C in the last 72 hours. Lipid Profile No results for input(s): CHOL, HDL, LDLCALC, TRIG, CHOLHDL, LDLDIRECT in the last 72 hours. Thyroid function studies No results for input(s): TSH, T4TOTAL, T3FREE, THYROIDAB in the last 72 hours.  Invalid input(s): FREET3 Urinalysis    Component Value Date/Time   COLORURINE YELLOW 11/28/2019 1455   APPEARANCEUR CLEAR 11/28/2019 1455   LABSPEC >1.046 (H) 11/28/2019 1455   PHURINE 5.0 11/28/2019 1455   GLUCOSEU NEGATIVE 11/28/2019 1455   HGBUR NEGATIVE 11/28/2019 1455   BILIRUBINUR NEGATIVE 11/28/2019 1455   KETONESUR NEGATIVE 11/28/2019 1455   PROTEINUR NEGATIVE 11/28/2019 1455   NITRITE NEGATIVE 11/28/2019 1455   LEUKOCYTESUR NEGATIVE 11/28/2019 1455    FURTHER DISCHARGE INSTRUCTIONS:   Get Medicines reviewed and adjusted: Please take all your medications with you for your next visit with your Primary MD     Laboratory/radiological data: Please request your Primary MD to go over all hospital tests and procedure/radiological results at the follow up, please ask your Primary MD to get all Hospital records sent to his/her office.   In some cases, they will be blood work, cultures and biopsy results pending at the time of your discharge. Please request that your primary care M.D. goes through all the records of your hospital data and follows up on these results.   Also Note the following: If you experience worsening of your admission symptoms, develop shortness of breath, life threatening emergency, suicidal or homicidal thoughts you must seek medical attention immediately by calling 911 or calling your MD immediately  if symptoms less severe.   You must read complete instructions/literature along with all the possible adverse reactions/side effects for all the Medicines you take and that have been prescribed to you. Take any new Medicines after you have completely understood and accpet all the possible adverse reactions/side effects.    Do not drive when taking Pain medications or sleeping medications (Benzodaizepines)   Do not take more than prescribed Pain, Sleep and Anxiety Medications. It is not advisable to combine anxiety,sleep and pain medications without talking with your primary care practitioner   Special Instructions: If you have smoked or chewed Tobacco  in the last 2 yrs please stop smoking, stop any regular Alcohol  and or any Recreational drug use.   Wear Seat belts while driving.  Please note: You were cared for by a hospitalist during your hospital stay. Once you are discharged, your primary care physician will handle any further medical issues. Please note that NO REFILLS for any discharge medications will be authorized once you are discharged, as it is imperative that you return to your primary care physician (or establish a relationship with a primary care physician if you do not  have one) for your post hospital discharge needs so that they can reassess your need for medications and monitor your lab values.  Time coordinating discharge: 40 minutes  SIGNED:  Marzetta Board, MD, PhD 12/02/2019, 2:07 PM

## 2019-12-02 NOTE — Progress Notes (Signed)
Daily Progress Note   Patient Name: Eddie Zhang       Date: 12/02/2019 DOB: 09-28-1980  Age: 39 y.o. MRN#: [id number] Attending Physician: Caren Griffins, MD Primary Care Physician: Patient, No Pcp Per Admit Date: 11/28/2019  Reason for Consultation/Follow-up: Establishing goals of care  Subjective: Ollen Gross at bedside. Patient tells me of pain of his abdomen and difficulty breathing d/t abdominal fullness. Morphine effective. Eating very small amounts - sips and bites.   Length of Stay: 4  Current Medications: Scheduled Meds:  . ferrous sulfate  325 mg Oral TID WC  . polyethylene glycol  17 g Oral BID  . sodium chloride flush  3 mL Intravenous Q12H  . sodium chloride flush  3 mL Intravenous Q12H    Continuous Infusions: . sodium chloride      PRN Meds: sodium chloride, acetaminophen **OR** acetaminophen, albuterol, antiseptic oral rinse, glycopyrrolate **OR** glycopyrrolate **OR** glycopyrrolate, haloperidol **OR** haloperidol **OR** haloperidol lactate, LORazepam **OR** LORazepam **OR** LORazepam, morphine injection, morphine CONCENTRATE **OR** morphine CONCENTRATE, ondansetron **OR** ondansetron (ZOFRAN) IV, ondansetron **OR** ondansetron (ZOFRAN) IV, polyvinyl alcohol, sodium chloride flush, traZODone  Physical Exam Constitutional:      General: He is not in acute distress. Pulmonary:     Effort: Pulmonary effort is normal.  Abdominal:     General: There is distension.     Tenderness: There is no abdominal tenderness.     Comments: firm  Musculoskeletal:     Right lower leg: Edema present.     Left lower leg: Edema present.  Skin:    General: Skin is warm and dry.  Neurological:     Mental Status: He is alert and oriented to person, place, and time.             Vital  Signs: BP (!) 111/59 (BP Location: Right Arm)   Pulse 88   Temp 99.1 F (37.3 C) (Oral)   Resp 17   Ht 5\' 7"  (1.702 m)   Wt 83.5 kg   SpO2 96%   BMI 28.83 kg/m  SpO2: SpO2: 96 % O2 Device: O2 Device: Room Air O2 Flow Rate:    Intake/output summary:   Intake/Output Summary (Last 24 hours) at 12/02/2019 1010 Last data filed at 12/01/2019 1614 Gross per 24 hour  Intake 360 ml  Output -  Net 360 ml   LBM: Last BM Date: 12/01/19 Baseline Weight: Weight: 83.5 kg Most recent weight: Weight: 83.5 kg       Palliative Assessment/Data: PPS 20% d/t PO intake      Patient Active Problem List   Diagnosis Date Noted  . Anemia   . DNR (do not resuscitate)   . Iron deficiency anemia   . Loss of weight   . Goals of care, counseling/discussion   . Palliative care encounter   . Metastatic colon cancer to liver ([place]) 11/28/2019  . Refusal of blood transfusions as patient is Jehovah's Witness 11/28/2019  . Elevated liver enzymes 11/28/2019  . Hypochromic anemia   . Leg swelling   . Ascites     Palliative Care Assessment & Plan   HPI: 39 y.o. male  with past medical history of alcohol use who was admitted  on 11/28/2019 with abdominal swelling.  His hgb was 5.9.  Imaging revealed a sigmoid mass, infiltrative masses thru out the liver and lymphadenopathy in the periaortic, retroperitoneum, and bilateral inguinal areas.   Assessment: Spoke with patient and friend Neoma Laming at bedside. Patient reports symptoms are well controlled. He is scheduled for paracentesis later today - this was discussed. Deborah requests changes made to letter for request for family to come visit from [place] - these were completed and returned to [employer].   We discussed care plan - patient has been deemed not eligible for [facility]. He is very much interested in hospice care and would like to be in a facility as he feels unable to care for himself at home d/t weakness, lethargy, and symptoms. He is interested in  seeing if other facilities would accept for comfort focused care and aggressive symptom management.   Recommendations/Plan:  CSW reaching out to [facility] with full comfort care and aggressive symptom management  Noted plan for paracentesis today  Goals of Care and Additional Recommendations:  Limitations on Scope of Treatment: Full Comfort Care  Code Status:  DNR  Prognosis:   < 2 weeks  Discharge Planning:  Hospice facility  Care plan was discussed with CSW, patient and friend Neoma Laming  Thank you for allowing the Palliative Medicine Team to assist in the care of this patient.   Total Time 25 minutes Prolonged Time Billed  no       Greater than 50%  of this time was spent counseling and coordinating care related to the above assessment and plan.  Juel Burrow, DNP, [facility] Palliative Medicine Team Team Phone # [phone]  Pager [phone]

## 2019-12-02 NOTE — Progress Notes (Signed)
Patient discharged to [facility] via [place]. Discharge packet given .

## 2019-12-02 NOTE — Progress Notes (Signed)
[facility]:  [facility]  Discussed hospice care with the cousin and he was able to translate and discuss with the pt. To make sure the pt was understanding all things related to hospice care and transitioning to hospice home in [facility]. The pt does confirm understanding of DNR and tells cousin this is his wishes. Spoke to our Market researcher and pt was approved for services at [facility] home at [facility].   His cousin will sign paperwork at our facility at 315pm today. Pt can transfer today if MD feels he is ready for discharge.   Notified Bubba Hales and she will arrange transport. Webb Silversmith RN [phone]

## 2019-12-02 NOTE — Plan of Care (Signed)

## 2019-12-02 NOTE — Plan of Care (Signed)
  Problem: Education: Goal: Knowledge of General Education information will improve Description Including pain rating scale, medication(s)/side effects and non-pharmacologic comfort measures Outcome: Progressing   

## 2019-12-02 NOTE — Progress Notes (Signed)
Patient presents for therapeutic  paracentesis. US limited abdomen shows trace amount of peritoneal fluid noted  Insufficient to perform a safe paracentesis. Procedure not performed.  

## 2019-12-02 NOTE — Progress Notes (Signed)
Patient ID: Eddie Zhang, male   DOB: 1981/01/06, 39 y.o.   MRN: [id number]   Pt with ETOH abuse with ascites First Para 5/2 in IR: 1.1 L yellow fluid - cytology pending Bloody stools x 2 mo Wt loss Abdominal distension and leg swelling Anemia: Hg 5.9 Gastroenterology consulted and he is status post flexible sigmoidoscopy on 5/1with findings ofnearly completely obstructing large mass in the distal sigmoid colon.  Jehovah Witness- refuses blood products- so pt not a candidate for surgery Also not candidate for chemotherapy secondary anemia and refusal for blood    Peritoneal Pleurx was ordered yesterday for this pt Not considered a candidate at that time --- see note from yesterday  Now pt is Home with Hospice and documented in chart But Still only one small volume paracentesis has been performed 11/30/19 and only 1.1 liter fluid  Discussed with Dr Annamaria Boots Please change order to paracentesis for today If re accumulation can be documented and larger volume can be seen in trend Would consider PleurX at that time   For now-- would recommend paracentesis when needed Reconsider PleurX if reaccumulation is larger volume and rapid reccurrence  I have asked provider to change order to para for today

## 2019-12-02 NOTE — Social Work (Signed)
Clinical Social Worker facilitated patient discharge including contacting patient family and facility to confirm patient discharge plans.  Clinical information faxed to facility and family agreeable with plan.  CSW arranged ambulance transport via [facility] to [facility]. RN to call [phone]  with report prior to discharge.  Clinical Social Worker will sign off for now as social work intervention is no longer needed. Please consult [place] again if new need arises.  Westley Hummer, MSW, LCSW Clinical Social Worker

## 2019-12-02 NOTE — Social Work (Addendum)
11:24pm- Pt has spoken with Darol Destine, NP with [facility], he does want to see if he would be eligble for [facility]. CSW has sent referral to [facility] with [facility]. Await review and eligibility. If not pt is okay with returning home with hospice but does want to see if he could be eligible for hospice home.  10:38am- CSW received message from [place], liaison with [facility]. Pt has not been approved for [facility]. Pt is eligible for home with hospice, [facility] liaison to be in touch with pt regarding this option.   Westley Hummer, MSW, [facility]

## 2019-12-02 NOTE — Plan of Care (Signed)
  Problem: Education: Goal: Knowledge of General Education information will improve Description: Including pain rating scale, medication(s)/side effects and non-pharmacologic comfort measures 12/02/2019 1730 by Joni Reining, RN Outcome: Progressing 12/02/2019 1623 by Joni Reining, RN Outcome: Adequate for Discharge   Problem: Health Behavior/Discharge Planning: Goal: Ability to manage health-related needs will improve 12/02/2019 1730 by Joni Reining, RN Outcome: Progressing 12/02/2019 1623 by Joni Reining, RN Outcome: Adequate for Discharge  Discharge to hospice.  Problem: Pain Managment: Goal: General experience of comfort will improve 12/02/2019 1730 by Joni Reining, RN Outcome: Progressing 12/02/2019 1623 by Joni Reining, RN Outcome: Adequate for Discharge  Patient states pain is adequately controlled by prn iv pain meds.

## 2019-12-02 NOTE — Progress Notes (Signed)
Palliative Medicine RN Note: Rec'd a call from pt's fiancee Neoma Laming asking for clarification re DNR requirement as it relates to transfer to inpatient hospice. She reports that pt still has on DNR bracelet, and she and Max are concerned that will prevent him being accepted to hospice. I explained that DNR is preferred/necessary for residential hospice. She verbalized understanding. I provided the address for [facility], where Max will be going tonight.  Neoma Laming also told me that the corrected letter we provided this am is still incorrect, as they had the wrong city names for his parents. They will ask the hospice to correct the letter tomorrow. If [facility] is unable or unwilling to help them, I offered to help her correct it tomorrow am when our NP Darol Destine returns to [facility].  Marjie Skiff Tywanna Seifer, RN, BSN, [facility] Palliative Medicine Team 12/02/2019 3:00 PM Office [phone]

## 2019-12-04 LAB — SURGICAL PATHOLOGY

## 2021-01-06 IMAGING — CT CT ABD-PELV W/ CM
2 of 5 series · 15 of 46 positions shown, 17 images · IV contrast (Omni 300)
Comparison: None.

CLINICAL DATA: Abdominal distension. History of cirrhosis.

EXAM:
CT ABDOMEN AND PELVIS WITH CONTRAST
TECHNIQUE: Multidetector CT imaging of the abdomen and pelvis was performed
using the standard protocol following bolus administration of
intravenous contrast.
CONTRAST:  100mL OMNIPAQUE IOHEXOL 300 MG/ML  SOLN

[Series 3: a/p w/ 5mm · axial · 0.86mm/px · z∈[-470,-5]mm · 12 of 105 slices shown, 14 images]
[im 6/105  soft-tissue]
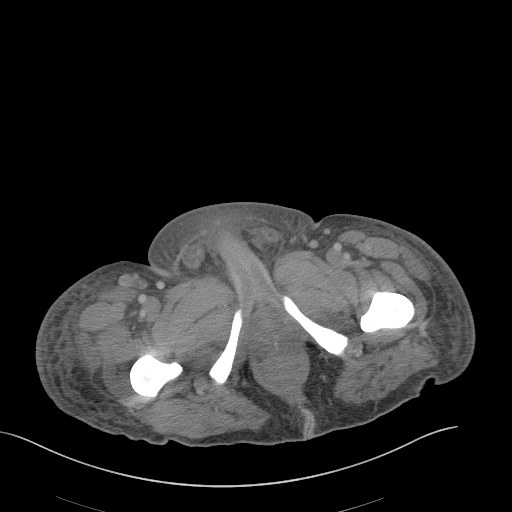
[im 6/105  bone]
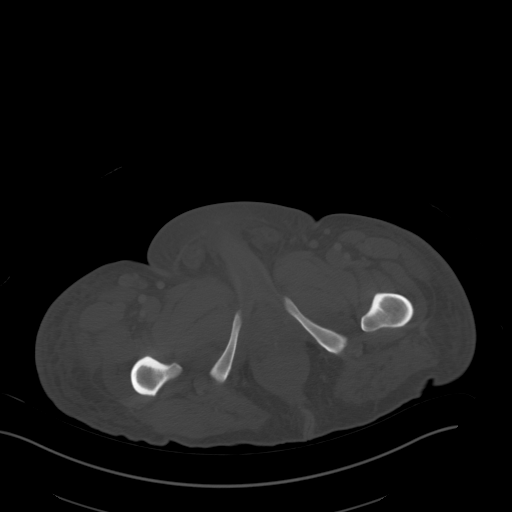
[im 17/105  soft-tissue]
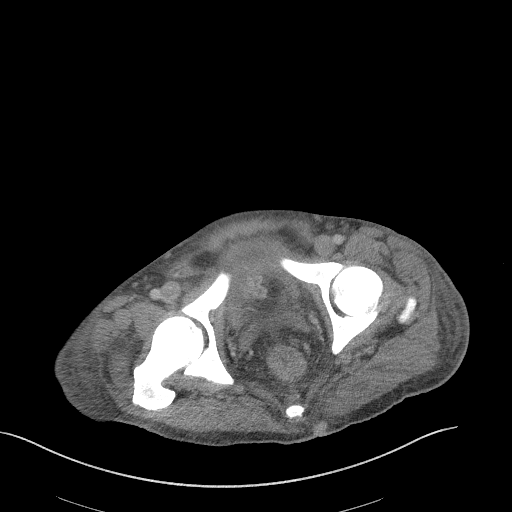
[im 22/105  soft-tissue]
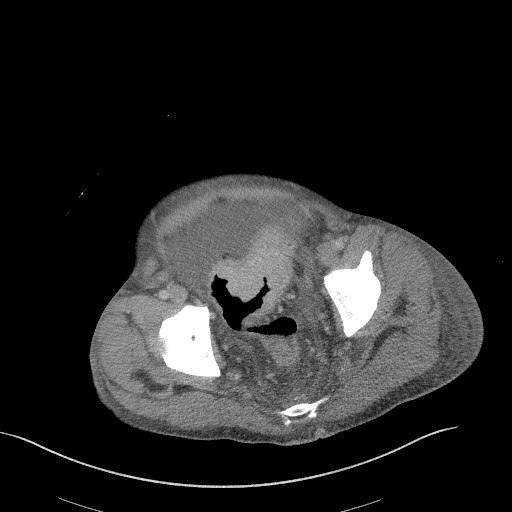
[im 33/105  soft-tissue]
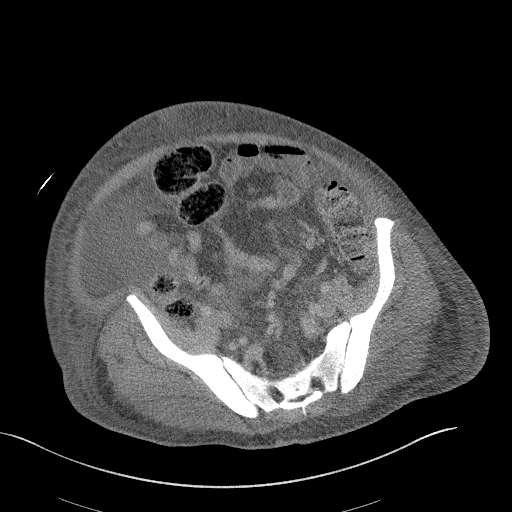
[im 39/105  soft-tissue]
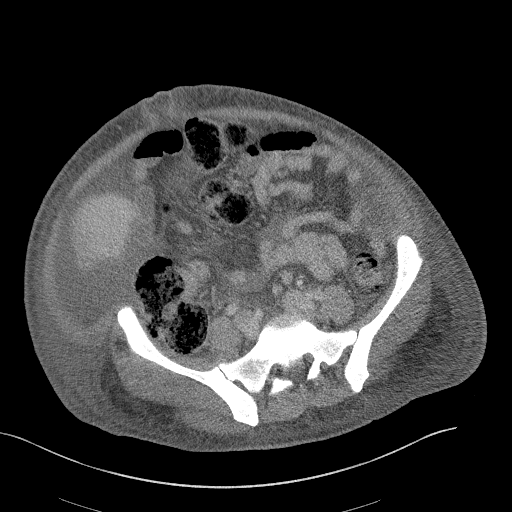
[im 50/105  soft-tissue]
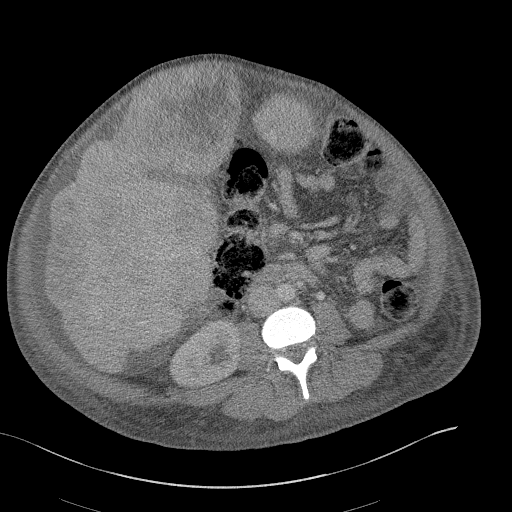
[im 55/105  soft-tissue]
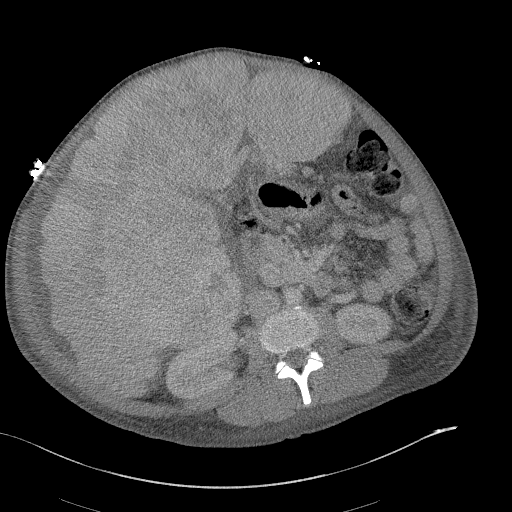
[im 66/105  soft-tissue]
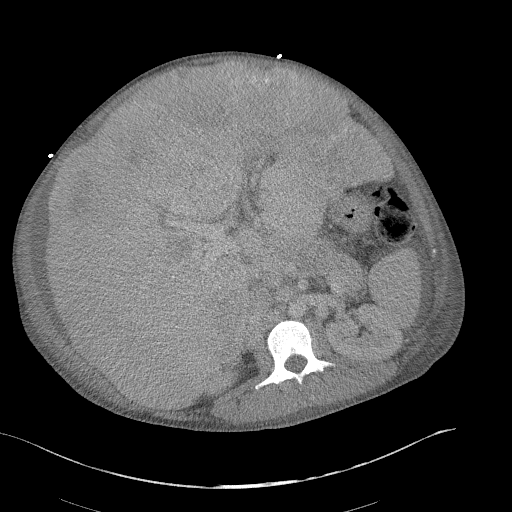
[im 72/105  soft-tissue]
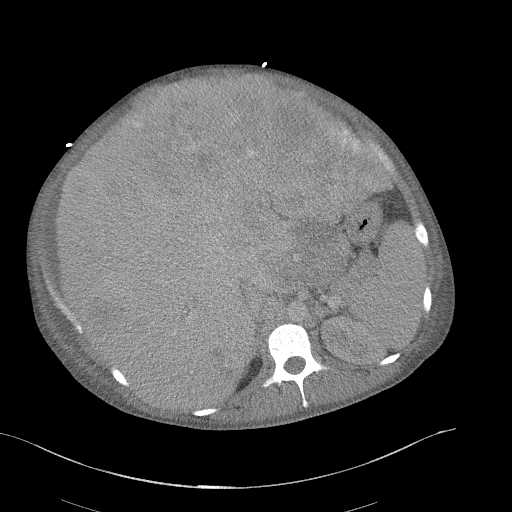
[im 72/105  bone]
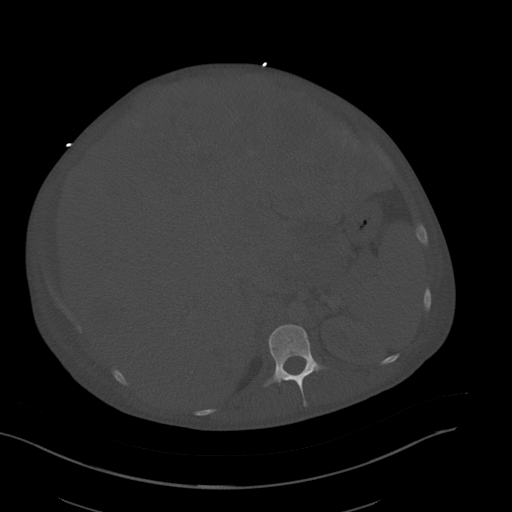
[im 83/105  soft-tissue]
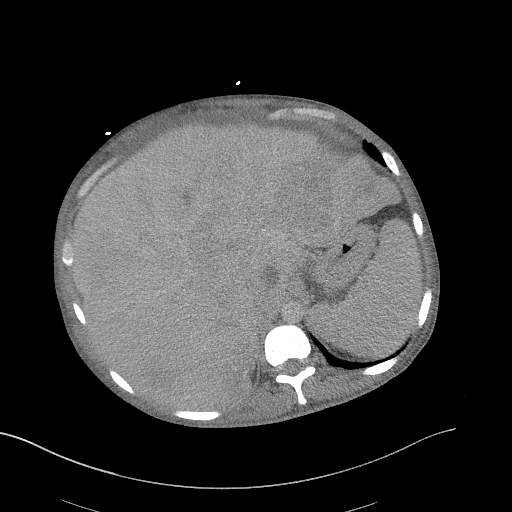
[im 88/105  soft-tissue]
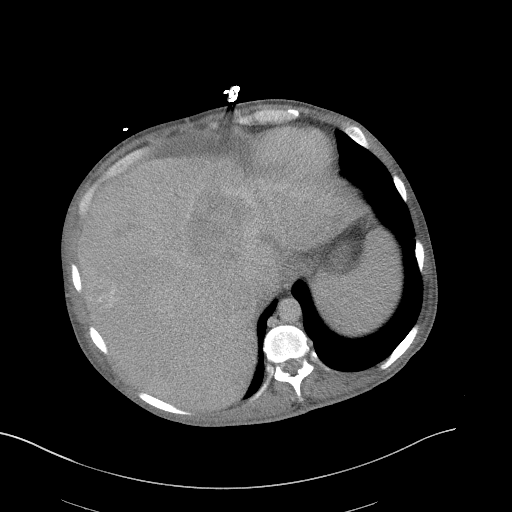
[im 99/105  soft-tissue]
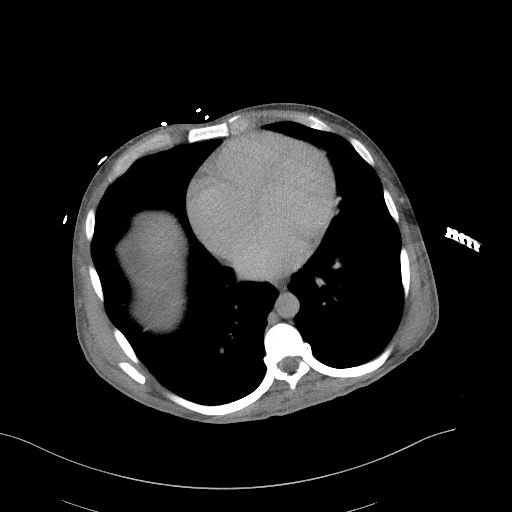

[Series 6: a/p w/ cor · coronal · 0.77mm/px · 3 of 186 slices shown]
[im 62/186  soft-tissue]
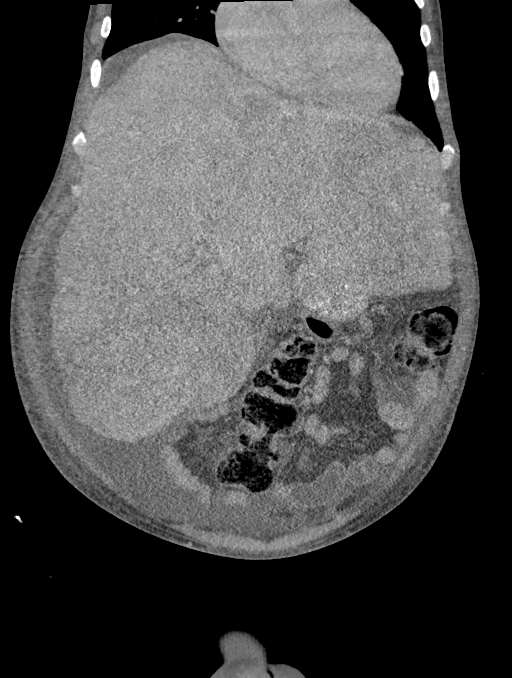
[im 83/186  soft-tissue]
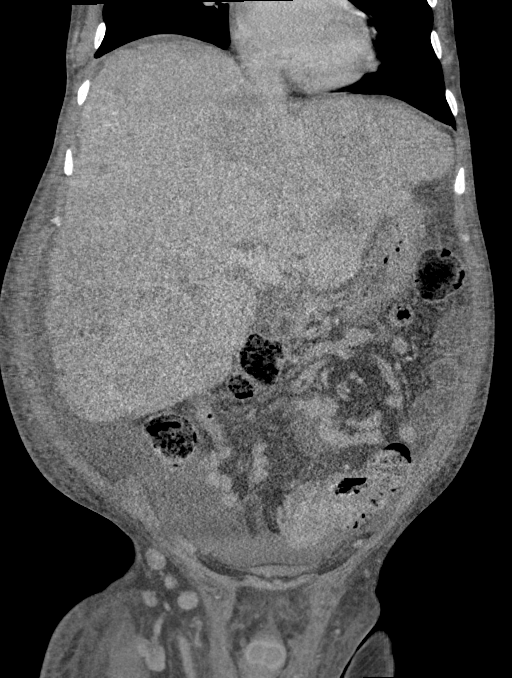
[im 103/186  soft-tissue]
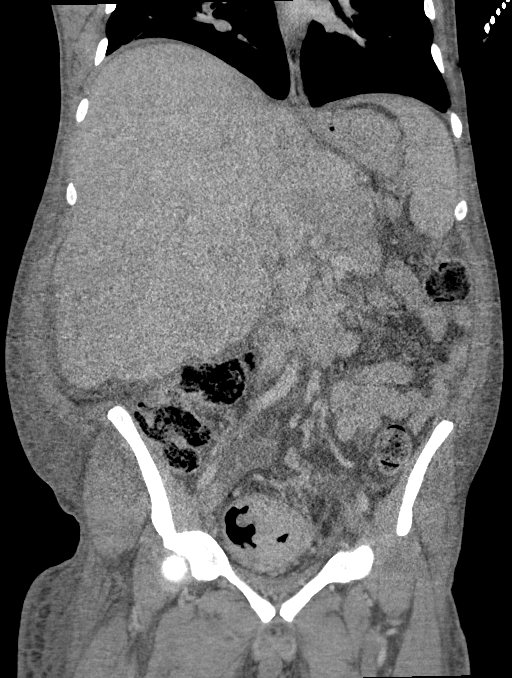

[15 of 46 positions shown; findings below may reference images not displayed]

FINDINGS: Lower chest: No acute abnormality.

Hepatobiliary: Liver is markedly enlarged. Infiltrative masses are
seen throughout the liver, majority are difficult to measure due to
poorly defined margins, largest measurable lesion within the
inferior aspects of the LEFT liver lobe (segment 4B), measuring
approximately 6 cm greatest dimension.

Gallbladder is decompressed. No bile duct dilatation seen.

Pancreas: Grossly normal.

Spleen: Unremarkable.

Adrenals/Urinary Tract: RIGHT kidney is compressed/displaced by the
enlarged RIGHT hepatic lobe, but appears otherwise unremarkable
without suspicious mass, stone or hydronephrosis. LEFT kidney is
unremarkable without mass, stone or hydronephrosis. Bladder is
decompressed.

Stomach/Bowel: Irregular thickening of the walls of the sigmoid
colon, highly suspicious for annular constricting apple-core
neoplastic mass. Fairly large amount of stool throughout the more
proximal portion of the colon which is nondistended. No dilated
small bowel loops.

Vascular/Lymphatic: No acute appearing vascular abnormality.
Conglomerate lymphadenopathy within the upper periaortic
retroperitoneum,, measuring approximately 2 cm short axis dimension
(series 3, image 44). Suspect additional retroperitoneal and
mesenteric lymphadenopathy, obscured by adjacent structures.
Additional mildly prominent lymph nodes within the bilateral
inguinal regions.

Reproductive: Prostate is unremarkable.

Other: Moderate amount of ascites within the abdomen and pelvis.

Musculoskeletal: No acute or suspicious osseous finding. Diffuse
anasarca.
IMPRESSION: 1. Irregular thickening of the walls of the sigmoid colon (series 3,
image 84), with an annular constricting "apple-core" appearance,
highly suspicious for colon cancer.
2. Infiltrative masses throughout the liver, with associated marked
hepatomegaly, majority of the liver lesions being difficult to
measure due to poorly defined margins, largest measurable lesion
within the inferior aspects of the LEFT liver lobe (segment 4B),
almost certainly metastatic in etiology.
3. Conglomerate lymphadenopathy within the upper periaortic
retroperitoneum, measuring approximately 2 cm short axis dimension,
highly suspicious for additional metastatic disease. Additional
mildly prominent lymph nodes within the bilateral inguinal regions.
Suspect additional retroperitoneal and mesenteric lymphadenopathy
obscured by adjacent structures.
4. Moderate amount of ascites within the abdomen and pelvis.
5. Diffuse anasarca.

## 2021-01-08 IMAGING — US US PARACENTESIS
1 series · 6 of 6 positions shown · non-contrast
Comparison: none

INDICATION: Abdominal distension secondary to hepatic masses and ascites which
is worrisome for metastatic colon cancer. Request for diagnostic and
therapeutic paracentesis.

[Series 1: us paracentesis · 6 of 6 slices shown]
[im 1/6]
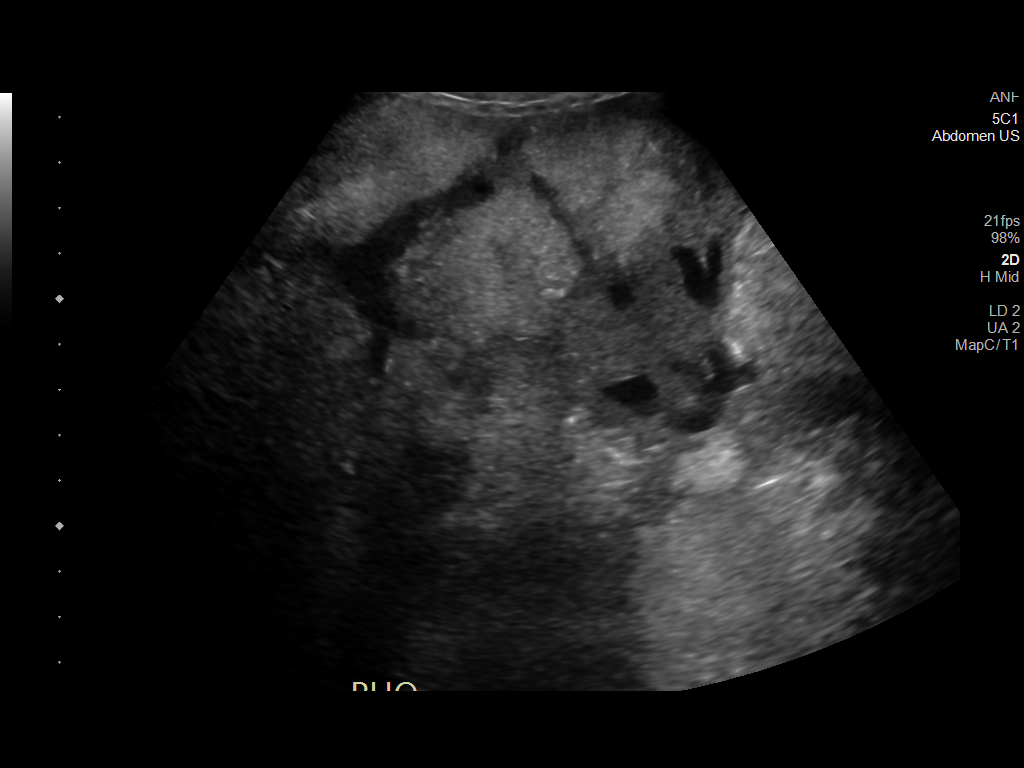
[im 2/6]
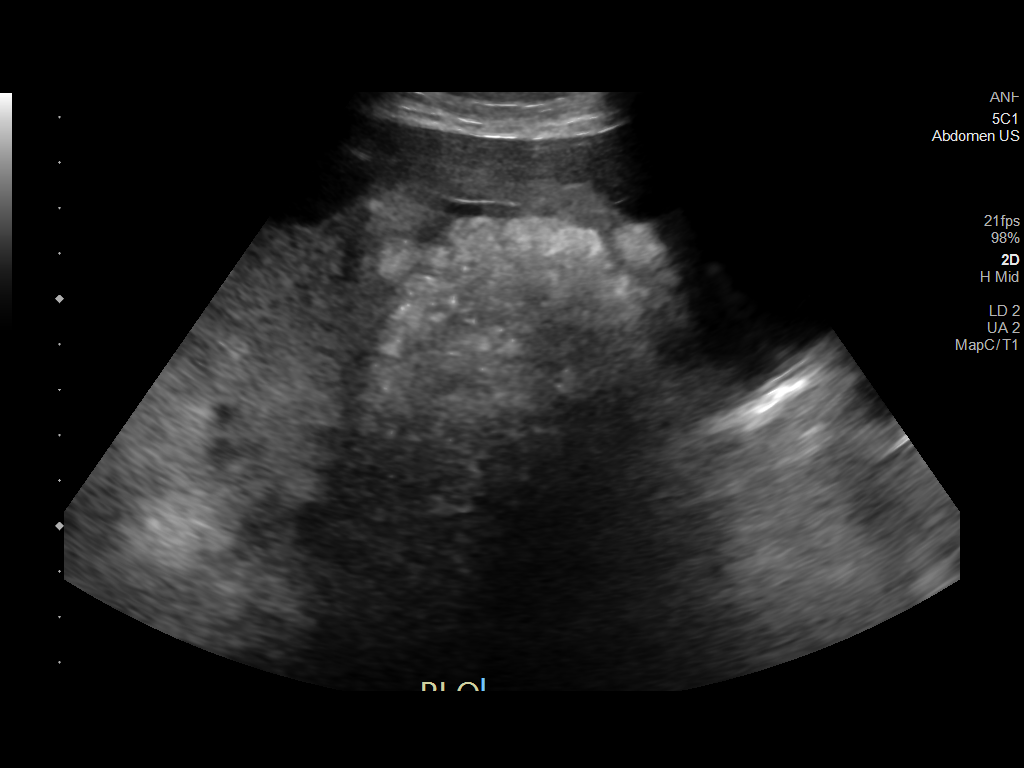
[im 3/6]
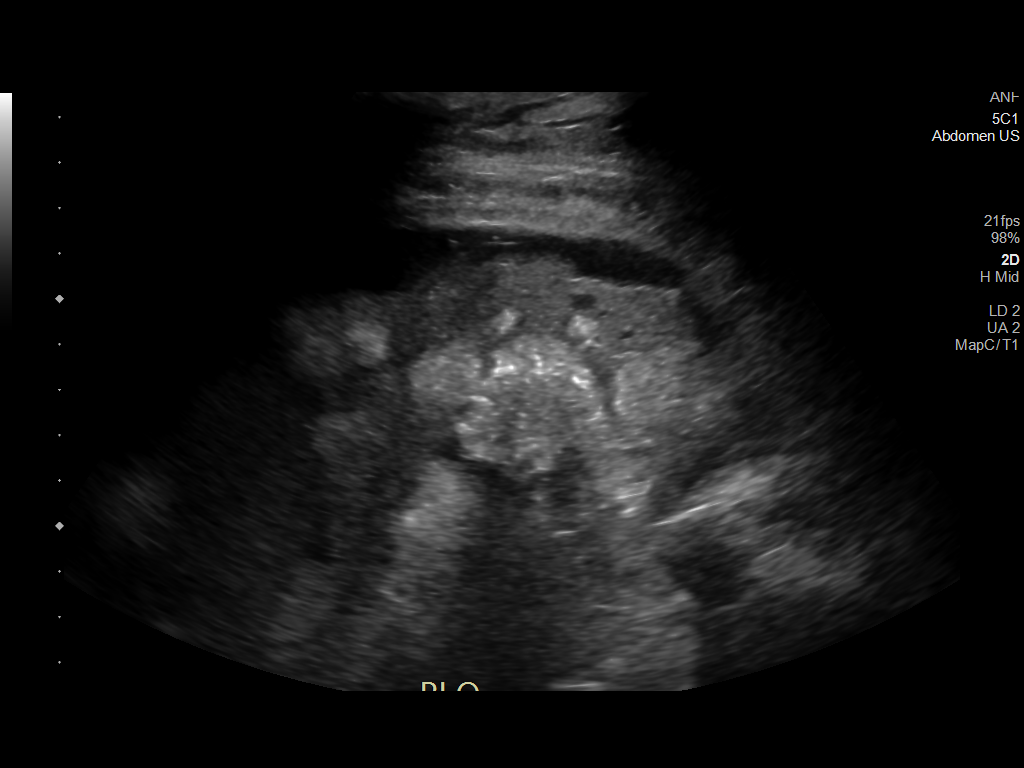
[im 4/6]
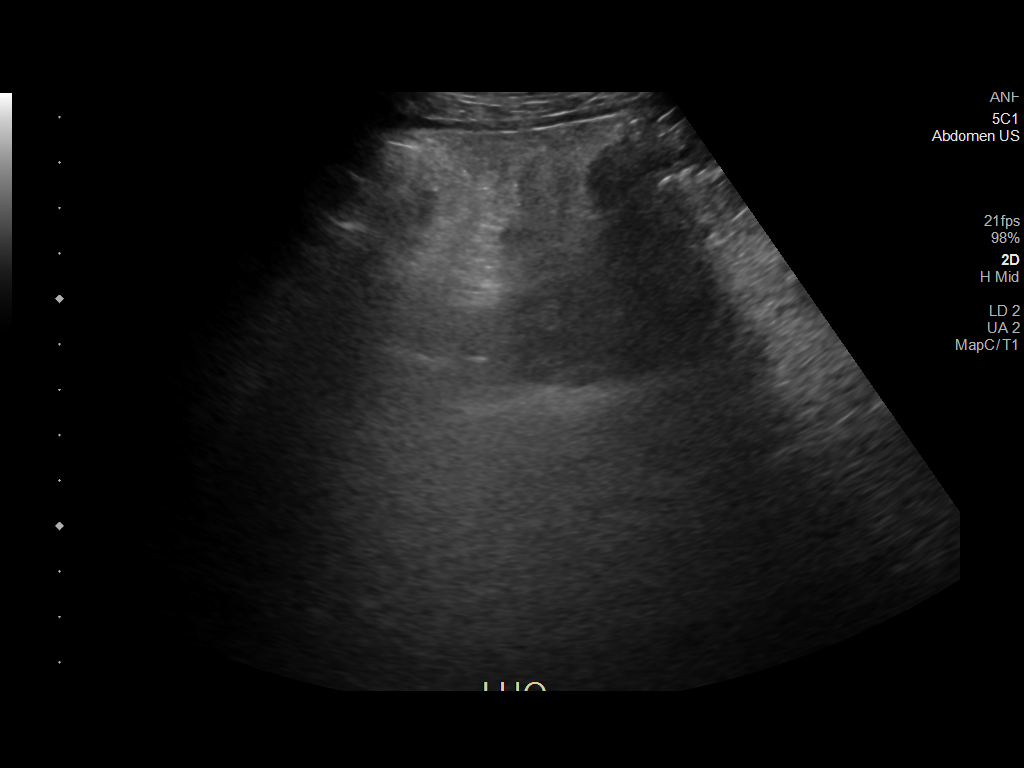
[im 5/6]
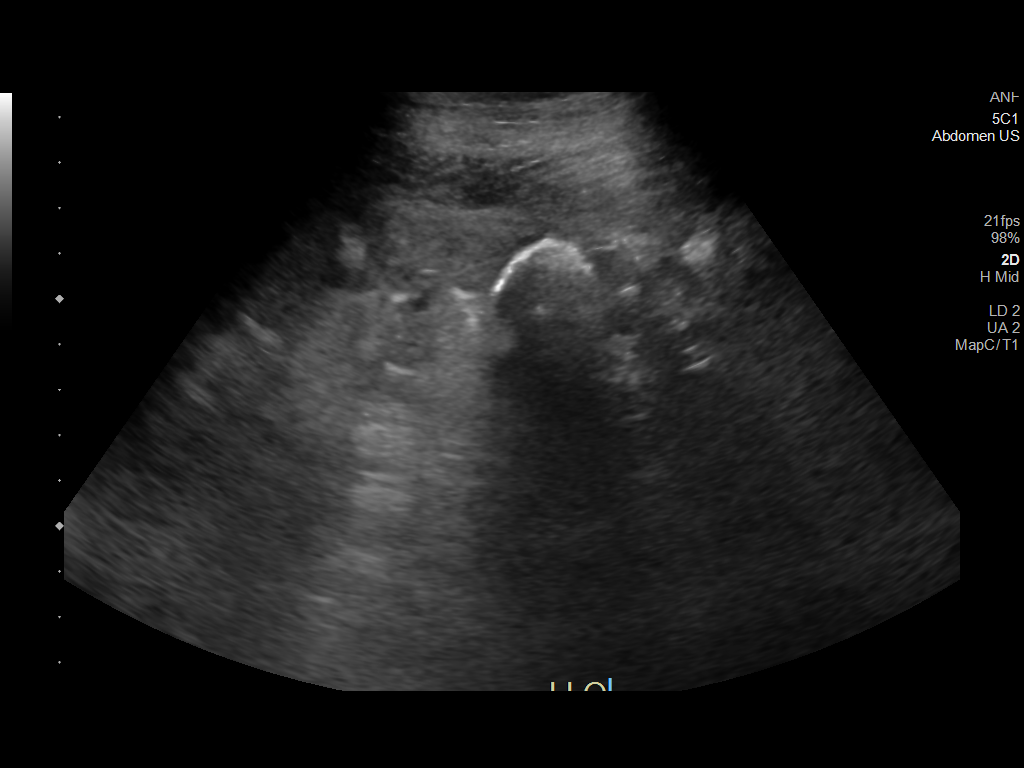
[im 6/6]
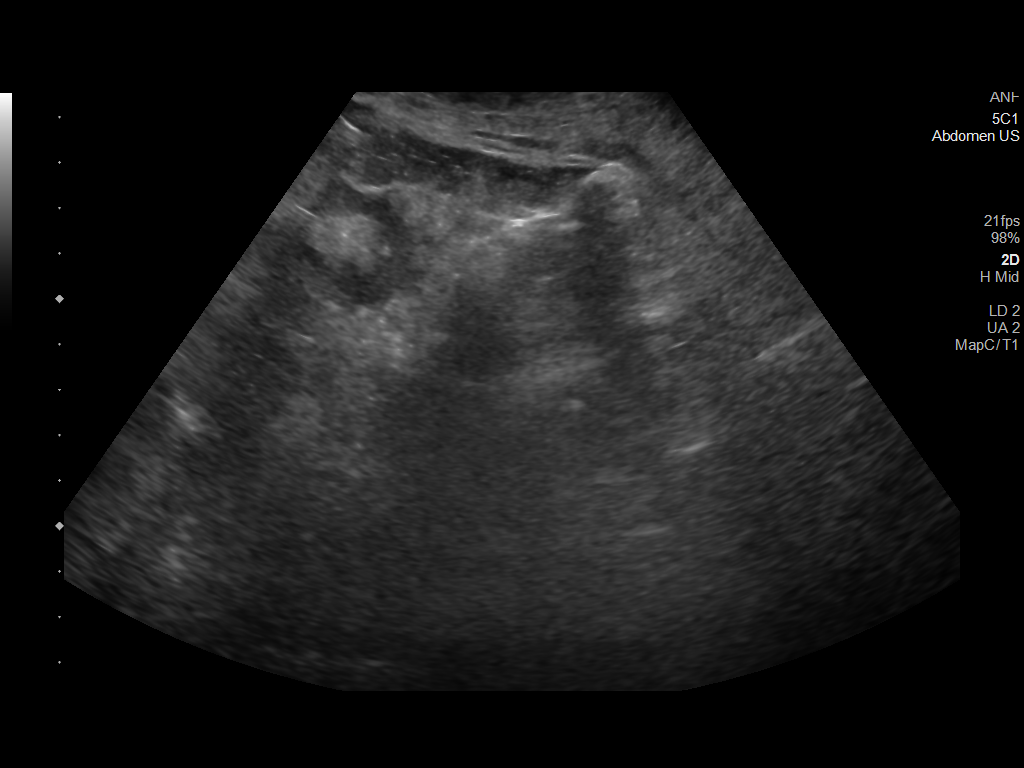

[6 of 6 positions shown; findings below may reference images not displayed]

EXAM:
ULTRASOUND GUIDED PARACENTESIS

MEDICATIONS:
1% lidocaine 10 mL

COMPLICATIONS:
None immediate.

PROCEDURE:
Informed written consent was obtained from the patient after a
discussion of the risks, benefits and alternatives to treatment. A
timeout was performed prior to the initiation of the procedure.

Initial ultrasound scanning demonstrates a small amount of ascites
within the right lower abdominal quadrant. The right lower abdomen
was prepped and draped in the usual sterile fashion. 1% lidocaine
was used for local anesthesia.

Following this, a 19 gauge, 7-cm, Yueh catheter was introduced. An
ultrasound image was saved for documentation purposes. The
paracentesis was performed. The catheter was removed and a dressing
was applied. The patient tolerated the procedure well without
immediate post procedural complication.
FINDINGS: A total of approximately 1.1 L of clear yellow fluid was removed.
Samples were sent to the laboratory as requested by the clinical
team.
IMPRESSION: Successful ultrasound-guided paracentesis yielding 1.1 liters of
peritoneal fluid.

## 2021-01-10 IMAGING — US IR ABDOMEN US LIMITED
1 series · 5 of 5 positions shown · non-contrast
Comparison: 11/30/2019, 11/28/2019

CLINICAL DATA: Ascites, metastatic colon cancer, cirrhosis
abdominal distension

EXAM:
LIMITED ABDOMEN ULTRASOUND FOR ASCITES
TECHNIQUE: Limited ultrasound survey for ascites was performed in all four
abdominal quadrants.

[Series 1: ir (id) (id)/(id)/(id) ir · 5 of 5 slices shown]
[im 1/5]
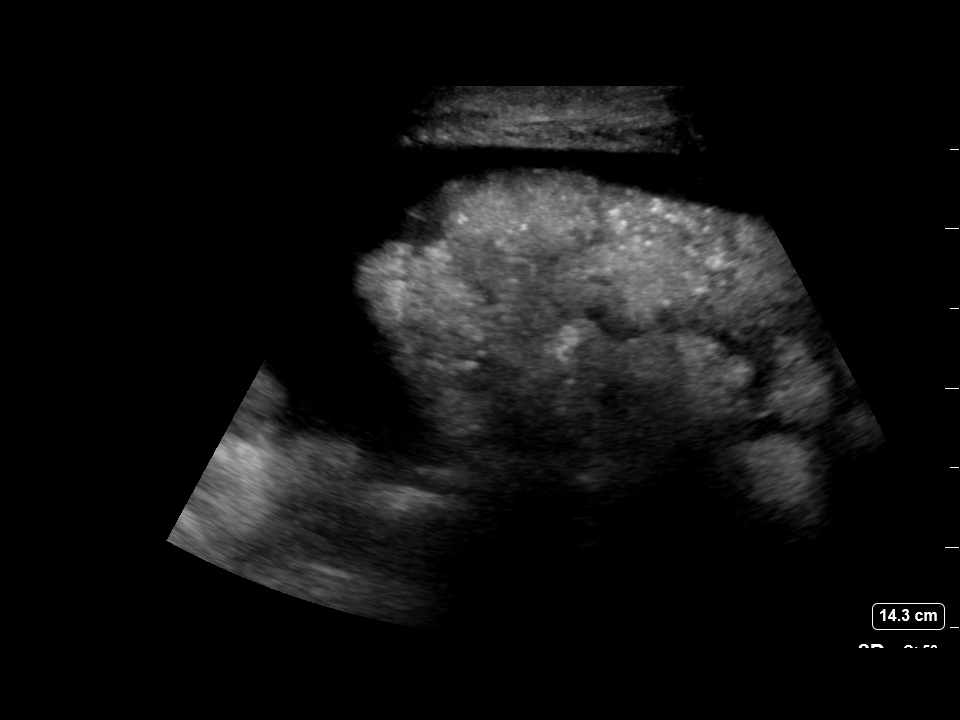
[im 2/5]
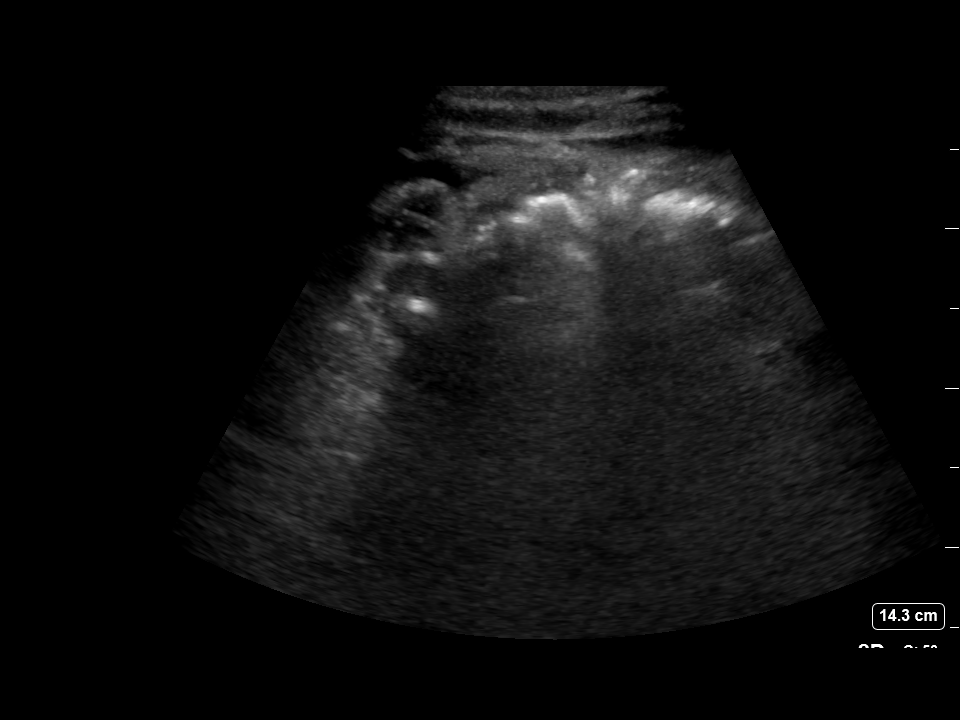
[im 3/5]
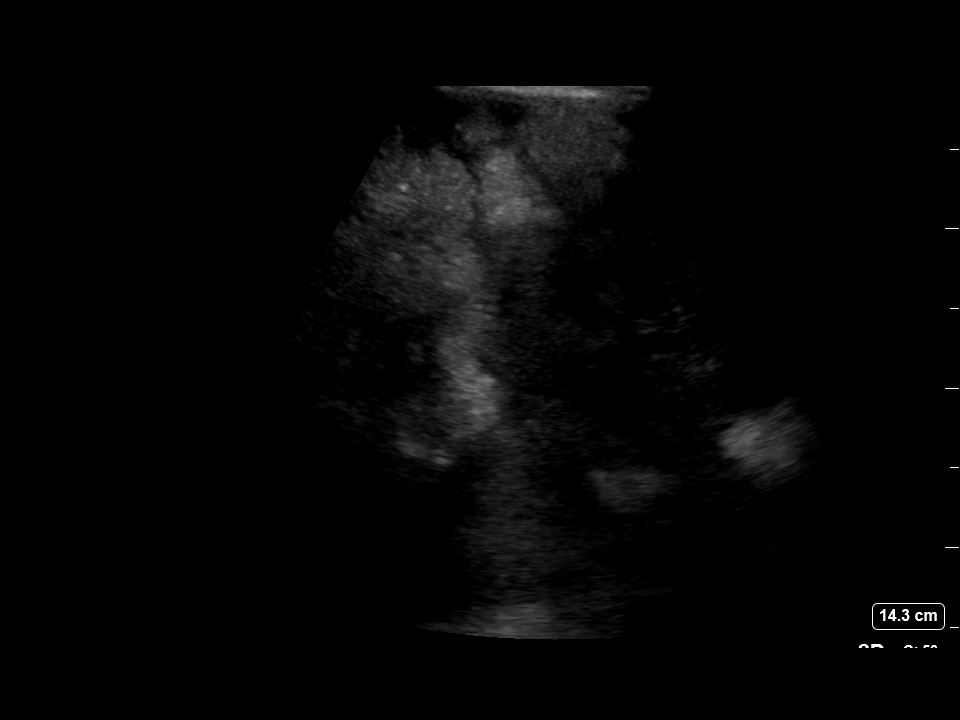
[im 4/5]
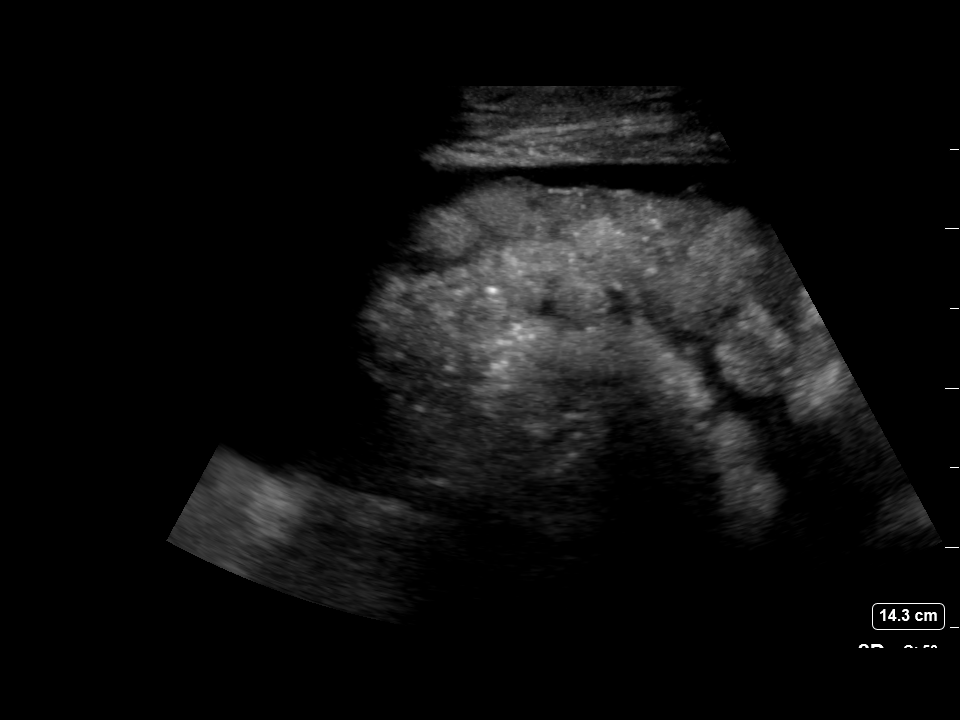
[im 5/5]
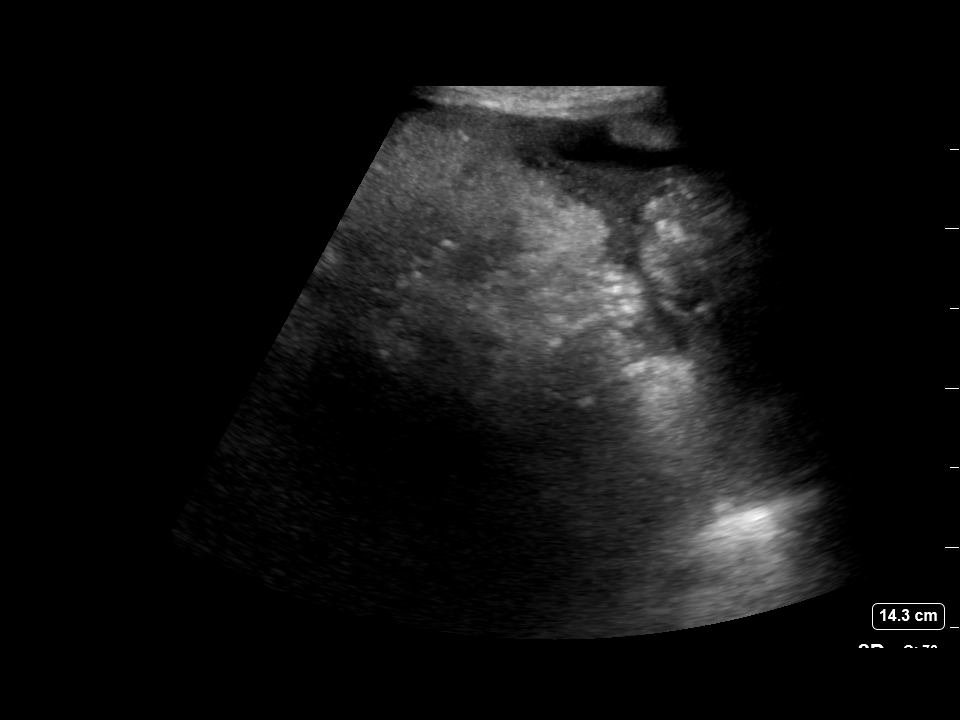

[5 of 5 positions shown; findings below may reference images not displayed]

FINDINGS: Survey of the abdominal 4 quadrants demonstrates only a trace amount
of abdominopelvic ascites. Not enough to warrant therapeutic
paracentesis. Procedure not performed.
IMPRESSION: Trace abdominopelvic ascites by ultrasound
